# Patient Record
Sex: Male | Born: 1990 | Race: White | Hispanic: No | Marital: Single | Smoking: Never smoker
Health system: Southern US, Community
[De-identification: ages and names within clinical notes are randomized; demographics above are authoritative.]

## PROBLEM LIST (undated history)

## (undated) DIAGNOSIS — G8929 Other chronic pain: Secondary | ICD-10-CM

## (undated) DIAGNOSIS — K828 Other specified diseases of gallbladder: Secondary | ICD-10-CM

## (undated) DIAGNOSIS — T8859XA Other complications of anesthesia, initial encounter: Secondary | ICD-10-CM

## (undated) DIAGNOSIS — T4145XA Adverse effect of unspecified anesthetic, initial encounter: Secondary | ICD-10-CM

## (undated) DIAGNOSIS — K219 Gastro-esophageal reflux disease without esophagitis: Secondary | ICD-10-CM

## (undated) DIAGNOSIS — F419 Anxiety disorder, unspecified: Secondary | ICD-10-CM

## (undated) DIAGNOSIS — J45909 Unspecified asthma, uncomplicated: Secondary | ICD-10-CM

## (undated) DIAGNOSIS — G5732 Lesion of lateral popliteal nerve, left lower limb: Secondary | ICD-10-CM

## (undated) DIAGNOSIS — Z8781 Personal history of (healed) traumatic fracture: Secondary | ICD-10-CM

## (undated) DIAGNOSIS — G629 Polyneuropathy, unspecified: Secondary | ICD-10-CM

## (undated) DIAGNOSIS — F1911 Other psychoactive substance abuse, in remission: Secondary | ICD-10-CM

## (undated) DIAGNOSIS — Z789 Other specified health status: Secondary | ICD-10-CM

## (undated) DIAGNOSIS — M869 Osteomyelitis, unspecified: Secondary | ICD-10-CM

## (undated) DIAGNOSIS — G2581 Restless legs syndrome: Secondary | ICD-10-CM

## (undated) DIAGNOSIS — Y990 Civilian activity done for income or pay: Secondary | ICD-10-CM

## (undated) DIAGNOSIS — Z9889 Other specified postprocedural states: Secondary | ICD-10-CM

## (undated) DIAGNOSIS — Z87448 Personal history of other diseases of urinary system: Secondary | ICD-10-CM

## (undated) DIAGNOSIS — R112 Nausea with vomiting, unspecified: Secondary | ICD-10-CM

## (undated) DIAGNOSIS — F4310 Post-traumatic stress disorder, unspecified: Secondary | ICD-10-CM

## (undated) HISTORY — DX: Other specified diseases of gallbladder: K82.8

## (undated) HISTORY — PX: FEMUR IM NAIL: SHX1597

## (undated) HISTORY — PX: CHOLECYSTECTOMY: SHX55

## (undated) HISTORY — PX: OTHER SURGICAL HISTORY: SHX169

## (undated) HISTORY — PX: TONSILLECTOMY: SUR1361

## (undated) HISTORY — PX: EXTERNAL FIXATOR APPLICATION: SHX1554

---

## 2010-01-23 ENCOUNTER — Inpatient Hospital Stay: Payer: Self-pay | Admitting: Unknown Physician Specialty

## 2012-09-03 ENCOUNTER — Emergency Department: Payer: Self-pay | Admitting: Unknown Physician Specialty

## 2012-09-03 LAB — CBC
HCT: 46.5 % (ref 40.0–52.0)
MCH: 30.5 pg (ref 26.0–34.0)
MCV: 87 fL (ref 80–100)
RBC: 5.36 10*6/uL (ref 4.40–5.90)
WBC: 11.8 10*3/uL — ABNORMAL HIGH (ref 3.8–10.6)

## 2012-09-03 LAB — COMPREHENSIVE METABOLIC PANEL
Alkaline Phosphatase: 68 U/L (ref 50–136)
Anion Gap: 7 (ref 7–16)
BUN: 13 mg/dL (ref 7–18)
Chloride: 107 mmol/L (ref 98–107)
Co2: 23 mmol/L (ref 21–32)
Creatinine: 1.19 mg/dL (ref 0.60–1.30)
EGFR (Non-African Amer.): >60
Osmolality: 274 (ref 275–301)
Potassium: 3.8 mmol/L (ref 3.5–5.1)
SGOT(AST): 27 U/L (ref 15–37)
SGPT (ALT): 29 U/L (ref 12–78)
Sodium: 137 mmol/L (ref 136–145)
Total Protein: 8.3 g/dL — ABNORMAL HIGH (ref 6.4–8.2)

## 2014-11-23 ENCOUNTER — Other Ambulatory Visit: Payer: Self-pay | Admitting: Urology

## 2014-11-29 ENCOUNTER — Encounter (HOSPITAL_COMMUNITY): Payer: Self-pay | Admitting: *Deleted

## 2014-11-29 NOTE — Progress Notes (Signed)
I called mother since noone had called me back regarding medications and she will call and leave me a voice mail to update medications.

## 2014-11-29 NOTE — Progress Notes (Signed)
Called and left message on home phone for clarifications of medications.

## 2014-12-01 ENCOUNTER — Ambulatory Visit (HOSPITAL_COMMUNITY)
Admission: RE | Admit: 2014-12-01 | Discharge: 2014-12-01 | Disposition: A | Discharge status: Home / Self Care | Payer: Worker's Compensation | Source: Ambulatory Visit | Attending: Urology | Admitting: Urology

## 2014-12-01 ENCOUNTER — Encounter (HOSPITAL_COMMUNITY): Payer: Self-pay | Admitting: *Deleted

## 2014-12-01 ENCOUNTER — Encounter (HOSPITAL_COMMUNITY)
Admission: RE | Disposition: A | Discharge status: Home / Self Care | Payer: Self-pay | Source: Ambulatory Visit | Attending: Urology

## 2014-12-01 ENCOUNTER — Ambulatory Visit (HOSPITAL_COMMUNITY): Payer: Worker's Compensation | Admitting: Anesthesiology

## 2014-12-01 DIAGNOSIS — Z79891 Long term (current) use of opiate analgesic: Secondary | ICD-10-CM | POA: Diagnosis not present

## 2014-12-01 DIAGNOSIS — Z79899 Other long term (current) drug therapy: Secondary | ICD-10-CM | POA: Diagnosis not present

## 2014-12-01 DIAGNOSIS — N210 Calculus in bladder: Secondary | ICD-10-CM | POA: Diagnosis not present

## 2014-12-01 DIAGNOSIS — F419 Anxiety disorder, unspecified: Secondary | ICD-10-CM | POA: Insufficient documentation

## 2014-12-01 HISTORY — PX: CYSTOSCOPY WITH LITHOLAPAXY: SHX1425

## 2014-12-01 HISTORY — DX: Civilian activity done for income or pay: Y99.0

## 2014-12-01 HISTORY — DX: Anxiety disorder, unspecified: F41.9

## 2014-12-01 LAB — BASIC METABOLIC PANEL
ANION GAP: 6 (ref 5–15)
BUN: 11 mg/dL (ref 6–20)
CALCIUM: 9.3 mg/dL (ref 8.9–10.3)
CO2: 27 mmol/L (ref 22–32)
Chloride: 104 mmol/L (ref 101–111)
Creatinine, Ser: 0.97 mg/dL (ref 0.61–1.24)
GFR calc Af Amer: >60 mL/min (ref >60)
GFR calc non Af Amer: >60 mL/min (ref >60)
GLUCOSE: 93 mg/dL (ref 65–99)
Potassium: 3.8 mmol/L (ref 3.5–5.1)
Sodium: 137 mmol/L (ref 135–145)

## 2014-12-01 LAB — CBC
HEMATOCRIT: 39.9 % (ref 39.0–52.0)
Hemoglobin: 13.4 g/dL (ref 13.0–17.0)
MCH: 28.8 pg (ref 26.0–34.0)
MCHC: 33.6 g/dL (ref 30.0–36.0)
MCV: 85.6 fL (ref 78.0–100.0)
PLATELETS: 201 10*3/uL (ref 150–400)
RBC: 4.66 MIL/uL (ref 4.22–5.81)
RDW: 13.2 % (ref 11.5–15.5)
WBC: 6 10*3/uL (ref 4.0–10.5)

## 2014-12-01 LAB — GLUCOSE, CAPILLARY: GLUCOSE-CAPILLARY: 93 mg/dL (ref 65–99)

## 2014-12-01 SURGERY — CYSTOSCOPY, WITH BLADDER CALCULUS LITHOLAPAXY
Anesthesia: General | Site: Bladder

## 2014-12-01 MED ORDER — MIDAZOLAM HCL 2 MG/2ML IJ SOLN
INTRAMUSCULAR | Status: AC
  Filled 2014-12-01: qty 4

## 2014-12-01 MED ORDER — HYDROMORPHONE HCL 1 MG/ML IJ SOLN
0.2500 mg | INTRAMUSCULAR | Status: DC | PRN
Start: 2014-12-01 — End: 2014-12-01
  Administered 2014-12-01 (×4): 0.5 mg via INTRAVENOUS

## 2014-12-01 MED ORDER — ONDANSETRON HCL 4 MG/2ML IJ SOLN
INTRAMUSCULAR | Status: DC | PRN
  Administered 2014-12-01: 4 mg via INTRAVENOUS

## 2014-12-01 MED ORDER — PROPOFOL 10 MG/ML IV BOLUS
INTRAVENOUS | Status: AC
  Filled 2014-12-01: qty 20

## 2014-12-01 MED ORDER — ONDANSETRON HCL 4 MG/2ML IJ SOLN: 4.0000 mg | Freq: Once | INTRAMUSCULAR | Status: DC | PRN

## 2014-12-01 MED ORDER — SODIUM CHLORIDE 0.9 % IR SOLN
Status: DC | PRN
  Administered 2014-12-01: 3000 mL

## 2014-12-01 MED ORDER — BELLADONNA ALKALOIDS-OPIUM 16.2-60 MG RE SUPP
RECTAL | Status: AC
  Filled 2014-12-01: qty 1

## 2014-12-01 MED ORDER — MIDAZOLAM HCL 5 MG/5ML IJ SOLN
INTRAMUSCULAR | Status: DC | PRN
  Administered 2014-12-01: 2 mg via INTRAVENOUS

## 2014-12-01 MED ORDER — LIDOCAINE HCL (CARDIAC) 20 MG/ML IV SOLN
INTRAVENOUS | Status: AC
  Filled 2014-12-01: qty 5

## 2014-12-01 MED ORDER — LIDOCAINE HCL (CARDIAC) 20 MG/ML IV SOLN
INTRAVENOUS | Status: DC | PRN
  Administered 2014-12-01: 100 mg via INTRAVENOUS

## 2014-12-01 MED ORDER — HYDROMORPHONE HCL 1 MG/ML IJ SOLN
INTRAMUSCULAR | Status: AC
  Filled 2014-12-01: qty 1

## 2014-12-01 MED ORDER — STERILE WATER FOR IRRIGATION IR SOLN
Status: DC | PRN
  Administered 2014-12-01: 500 mL

## 2014-12-01 MED ORDER — DEXAMETHASONE SODIUM PHOSPHATE 10 MG/ML IJ SOLN
INTRAMUSCULAR | Status: DC | PRN
  Administered 2014-12-01: 10 mg via INTRAVENOUS

## 2014-12-01 MED ORDER — FENTANYL CITRATE (PF) 100 MCG/2ML IJ SOLN
25.0000 ug | INTRAMUSCULAR | Status: DC | PRN
  Administered 2014-12-01 (×2): 50 ug via INTRAVENOUS

## 2014-12-01 MED ORDER — OXYCODONE HCL 15 MG PO TABS: 15.0000 mg | ORAL_TABLET | ORAL | Status: DC | PRN

## 2014-12-01 MED ORDER — LIDOCAINE HCL 2 % EX GEL
CUTANEOUS | Status: AC
  Filled 2014-12-01: qty 10

## 2014-12-01 MED ORDER — 0.9 % SODIUM CHLORIDE (POUR BTL) OPTIME
TOPICAL | Status: DC | PRN
  Administered 2014-12-01: 1000 mL

## 2014-12-01 MED ORDER — ONDANSETRON HCL 4 MG/2ML IJ SOLN
INTRAMUSCULAR | Status: AC
  Filled 2014-12-01: qty 2

## 2014-12-01 MED ORDER — FENTANYL CITRATE (PF) 100 MCG/2ML IJ SOLN
INTRAMUSCULAR | Status: AC
  Filled 2014-12-01: qty 4

## 2014-12-01 MED ORDER — GENTAMICIN IN SALINE 1.6-0.9 MG/ML-% IV SOLN: 80.0000 mg | INTRAVENOUS | Status: DC

## 2014-12-01 MED ORDER — LACTATED RINGERS IV SOLN
INTRAVENOUS | Status: DC
  Administered 2014-12-01: 1000 mL via INTRAVENOUS

## 2014-12-01 MED ORDER — DEXTROSE 5 % IV SOLN
360.0000 mg | INTRAVENOUS | Status: AC
  Administered 2014-12-01: 360 mg via INTRAVENOUS
  Filled 2014-12-01: qty 9

## 2014-12-01 MED ORDER — FENTANYL CITRATE (PF) 100 MCG/2ML IJ SOLN
INTRAMUSCULAR | Status: AC
  Filled 2014-12-01: qty 2

## 2014-12-01 MED ORDER — DEXAMETHASONE SODIUM PHOSPHATE 10 MG/ML IJ SOLN
INTRAMUSCULAR | Status: AC
  Filled 2014-12-01: qty 1

## 2014-12-01 MED ORDER — FENTANYL CITRATE (PF) 100 MCG/2ML IJ SOLN
INTRAMUSCULAR | Status: DC | PRN
  Administered 2014-12-01: 100 ug via INTRAVENOUS

## 2014-12-01 MED ORDER — PROPOFOL 10 MG/ML IV BOLUS
INTRAVENOUS | Status: DC | PRN
  Administered 2014-12-01: 200 mg via INTRAVENOUS

## 2014-12-01 SURGICAL SUPPLY — 16 items
BAG URINE DRAINAGE (UROLOGICAL SUPPLIES) ×4 IMPLANT
BAG URO CATCHER STRL LF (DRAPE) ×4 IMPLANT
BASKET LASER NITINOL 1.9FR (BASKET) IMPLANT
BASKET STNLS GEMINI 4WIRE 3FR (BASKET) IMPLANT
CATH FOLEY 2WAY SLVR 18FR 30CC (CATHETERS) ×4 IMPLANT
CATH INTERMIT  6FR 70CM (CATHETERS) ×4 IMPLANT
EXTRACTOR STONE NITINOL NGAGE (UROLOGICAL SUPPLIES) IMPLANT
GLOVE BIO SURGEON STRL SZ8 (GLOVE) ×4 IMPLANT
GOWN STRL REUS W/TWL LRG LVL3 (GOWN DISPOSABLE) ×8 IMPLANT
GUIDEWIRE ANG ZIPWIRE 038X150 (WIRE) ×4 IMPLANT
GUIDEWIRE STR DUAL SENSOR (WIRE) ×4 IMPLANT
MANIFOLD NEPTUNE II (INSTRUMENTS) ×4 IMPLANT
PACK CYSTO (CUSTOM PROCEDURE TRAY) ×4 IMPLANT
TUBE FEEDING 8FR 16IN STR KANG (MISCELLANEOUS) IMPLANT
TUBING CONNECTING 10 (TUBING) ×3 IMPLANT
TUBING CONNECTING 10' (TUBING) ×1

## 2014-12-01 NOTE — Brief Op Note (Signed)
12/01/2014  1:42 PM  PATIENT:  Allen Sims  24 y.o. male  PRE-OPERATIVE DIAGNOSIS:  BLADDER STONE  POST-OPERATIVE DIAGNOSIS:  BLADDER STONE  PROCEDURE:  Procedure(s): CYSTOSCOPY LITHOLAPAXY (N/A)  SURGEON:  Surgeon(s) and Role:    * Malen Gauze, MD - Primary  PHYSICIAN ASSISTANT:   ASSISTANTS: none   ANESTHESIA:   general  EBL:   minimal  BLOOD ADMINISTERED:none  DRAINS: Urinary Catheter (Foley)   LOCAL MEDICATIONS USED:  NONE  SPECIMEN:  Source of Specimen:  bladder calculi  DISPOSITION OF SPECIMEN:  N/A  COUNTS:  YES  TOURNIQUET:  * No tourniquets in log *  DICTATION: .Note written in EPIC  PLAN OF CARE: Discharge to home after PACU  PATIENT DISPOSITION:  PACU - hemodynamically stable.   Delay start of Pharmacological VTE agent (>24hrs) due to surgical blood loss or risk of bleeding: not applicable

## 2014-12-01 NOTE — Anesthesia Postprocedure Evaluation (Signed)
  Anesthesia Post-op Note  Patient: Allen Sims  Procedure(s) Performed: Procedure(s) (LRB): CYSTOSCOPY LITHOLAPAXY (N/A)  Patient Location: PACU  Anesthesia Type: General  Level of Consciousness: awake and alert   Airway and Oxygen Therapy: Patient Spontanous Breathing  Post-op Pain: mild  Post-op Assessment: Post-op Vital signs reviewed, Patient's Cardiovascular Status Stable, Respiratory Function Stable, Patent Airway and No signs of Nausea or vomiting  Last Vitals:  Filed Vitals:   12/01/14 1034  BP: 128/77  Pulse: 108  Temp: 36.4 C  Resp: 18    Post-op Vital Signs: stable   Complications: No apparent anesthesia complications

## 2014-12-01 NOTE — Transfer of Care (Signed)
Immediate Anesthesia Transfer of Care Note  Patient: Allen Sims  Procedure(s) Performed: Procedure(s): CYSTOSCOPY LITHOLAPAXY (N/A)  Patient Location: PACU  Anesthesia Type:General  Level of Consciousness: sedated  Airway & Oxygen Therapy: Patient Spontanous Breathing and Patient connected to face mask oxygen  Post-op Assessment: Report given to RN and Post -op Vital signs reviewed and stable  Post vital signs: Reviewed and stable  Last Vitals:  Filed Vitals:   12/01/14 1034  BP: 128/77  Pulse: 108  Temp: 36.4 C  Resp: 18    Complications: No apparent anesthesia complications

## 2014-12-01 NOTE — Progress Notes (Signed)
Dr. Delrae Alfred notified of patient's continual post-op pain- orders given- also made aware of patient's heart rates being between 107-115

## 2014-12-01 NOTE — Discharge Instructions (Signed)
Foley Catheter Care A Foley catheter is a soft, flexible tube that is placed into the bladder to drain urine. A Foley catheter may be inserted if:  You leak urine or are not able to control when you urinate (urinary incontinence).  You are not able to urinate when you need to (urinary retention).  You had prostate surgery or surgery on the genitals.  You have certain medical conditions, such as multiple sclerosis, dementia, or a spinal cord injury. If you are going home with a Foley catheter in place, follow the instructions below. TAKING CARE OF THE CATHETER  Wash your hands with soap and water.  Using mild soap and warm water on a clean washcloth:  Clean the area on your body closest to the catheter insertion site using a circular motion, moving away from the catheter. Never wipe toward the catheter because this could sweep bacteria up into the urethra and cause infection.  Remove all traces of soap. Pat the area dry with a clean towel. For males, reposition the foreskin.  Attach the catheter to your leg so there is no tension on the catheter. Use adhesive tape or a leg strap. If you are using adhesive tape, remove any sticky residue left behind by the previous tape you used.  Keep the drainage bag below the level of the bladder, but keep it off the floor.  Check throughout the day to be sure the catheter is working and urine is draining freely. Make sure the tubing does not become kinked.  Do not pull on the catheter or try to remove it. Pulling could damage internal tissues. TAKING CARE OF THE DRAINAGE BAGS You will be given two drainage bags to take home. One is a large overnight drainage bag, and the other is a smaller leg bag that fits underneath clothing. You may wear the overnight bag at any time, but you should never wear the smaller leg bag at night. Follow the instructions below for how to empty, change, and clean your drainage bags. Emptying the Drainage Bag You must empty  your drainage bag when it is  - full or at least 2-3 times a day.  Wash your hands with soap and water.  Keep the drainage bag below your hips, below the level of your bladder. This stops urine from going back into the tubing and into your bladder.  Hold the dirty bag over the toilet or a clean container.  Open the pour spout at the bottom of the bag and empty the urine into the toilet or container. Do not let the pour spout touch the toilet, container, or any other surface. Doing so can place bacteria on the bag, which can cause an infection.  Clean the pour spout with a gauze pad or cotton ball that has rubbing alcohol on it.  Close the pour spout.  Attach the bag to your leg with adhesive tape or a leg strap.  Wash your hands well. Changing the Drainage Bag Change your drainage bag once a month or sooner if it starts to smell bad or look dirty. Below are steps to follow when changing the drainage bag.  Wash your hands with soap and water.  Pinch off the rubber catheter so that urine does not spill out.  Disconnect the catheter tube from the drainage tube at the connection valve. Do not let the tubes touch any surface.  Clean the end of the catheter tube with an alcohol wipe. Use a different alcohol wipe to clean the  end of the drainage tube.  Connect the catheter tube to the drainage tube of the clean drainage bag.  Attach the new bag to the leg with adhesive tape or a leg strap. Avoid attaching the new bag too tightly.  Wash your hands well. Cleaning the Drainage Bag 1. Wash your hands with soap and water. 2. Wash the bag in warm, soapy water. 3. Rinse the bag thoroughly with warm water. 4. Fill the bag with a solution of white vinegar and water (1 cup vinegar to 1 qt warm water [.2 L vinegar to 1 L warm water]). Close the bag and soak it for 30 minutes in the solution. 5. Rinse the bag with warm water. 6. Hang the bag to dry with the pour spout open and hanging  downward. 7. Store the clean bag (once it is dry) in a clean plastic bag. 8. Wash your hands well. PREVENTING INFECTION  Wash your hands before and after handling your catheter.  Take showers daily and wash the area where the catheter enters your body. Do not take baths. Replace wet leg straps with dry ones, if this applies.  Do not use powders, sprays, or lotions on the genital area. Only use creams, lotions, or ointments as directed by your caregiver.  For females, wipe from front to back after each bowel movement.  Drink enough fluids to keep your urine clear or pale yellow unless you have a fluid restriction.  Do not let the drainage bag or tubing touch or lie on the floor.  Wear cotton underwear to absorb moisture and to keep your skin drier. SEEK MEDICAL CARE IF:   Your urine is cloudy or smells unusually bad.  Your catheter becomes clogged.  You are not draining urine into the bag or your bladder feels full.  Your catheter starts to leak. SEEK IMMEDIATE MEDICAL CARE IF:   You have pain, swelling, redness, or pus where the catheter enters the body.  You have pain in the abdomen, legs, lower back, or bladder.  You have a fever.  You see blood fill the catheter, or your urine is pink or red.  You have nausea, vomiting, or chills.  Your catheter gets pulled out. MAKE SURE YOU:   Understand these instructions.  Will watch your condition.  Will get help right away if you are not doing well or get worse. Document Released: 04/15/2005 Document Revised: 08/30/2013 Document Reviewed: 04/06/2012 [facility] Patient Information 2015 [place], [place]. This information is not intended to replace advice given to you by your health care provider. Make sure you discuss any questions you have with your health care provider.     General Anesthesia, Care After Refer to this sheet in the next few weeks. These instructions provide you with information on caring for yourself after  your procedure. Your health care provider may also give you more specific instructions. Your treatment has been planned according to current medical practices, but problems sometimes occur. Call your health care provider if you have any problems or questions after your procedure. WHAT TO EXPECT AFTER THE PROCEDURE After the procedure, it is typical to experience:  Sleepiness.  Nausea and vomiting. HOME CARE INSTRUCTIONS  For the first 24 hours after general anesthesia:  Have a responsible person with you.  Do not drive a car. If you are alone, do not take public transportation.  Do not drink alcohol.  Do not take medicine that has not been prescribed by your health care provider.  Do not sign  important papers or make important decisions.  You may resume a normal diet and activities as directed by your health care provider.  Change bandages (dressings) as directed.  If you have questions or problems that seem related to general anesthesia, call the hospital and ask for the anesthetist or anesthesiologist on call. SEEK MEDICAL CARE IF:  You have nausea and vomiting that continue the day after anesthesia.  You develop a rash. SEEK IMMEDIATE MEDICAL CARE IF:   You have difficulty breathing.  You have chest pain.  You have any allergic problems. Document Released: 07/22/2000 Document Revised: 04/20/2013 Document Reviewed: 10/29/2012 [facility] Patient Information 2015 [place], [place]. This information is not intended to replace advice given to you by your health care provider. Make sure you discuss any questions you have with your health care provider.
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
## 2014-12-02 ENCOUNTER — Encounter (HOSPITAL_COMMUNITY): Payer: Self-pay | Admitting: Urology

## 2014-12-02 NOTE — Op Note (Signed)
.  Preoperative diagnosis: Bladder Caluli  Postoperative diagnosis: Same  Procedure: 1 cystoscopy 2. cystolithalopaxy  Attending: Wilkie Aye  Anesthesia: General  Estimated blood loss: None  Drains: 18 French foley catheter  Specimens: stone for analysis  Antibiotics: Gentamicin  Findings: numerous bladder calculi. Ureteral orifices in normal anatomic location  Indications: Patient is a 24 year old male with a history of pelvic fracture and bladder repair. He developed bladder stones causes urinary retention.  After discussing treatment options, they decided proceed with cystolithalopaxy.  Procedure her in detail: The patient was brought to the operating room and a brief timeout was done to ensure correct patient, correct procedure, correct site.  General anesthesia was administered patient was placed in dorsal lithotomy position.  Their genitalia was then prepped and draped in usual sterile fashion.  A rigid 22 French cystoscope was passed in the urethra and the bladder.  Bladder was inspected free masses or lesions.  the ureteral orifices were in the normal orthotopic locations.Using the grasper the bladder stones were broken into smaller pieces and the pieces were then removed. We then irrigated the bladder several to remove the residual stone fragments. A foley catheter was then placed. the bladder was then drained and this concluded the procedure which was well tolerated by patient.  Complications: None  Condition: Stable, extubated, transferred to PACU  Plan: Patient is to be discharged home. He will followup tomorrow for a voiding trial

## 2014-12-21 ENCOUNTER — Encounter (HOSPITAL_COMMUNITY): Payer: Self-pay

## 2014-12-21 ENCOUNTER — Encounter (HOSPITAL_COMMUNITY)
Admission: RE | Admit: 2014-12-21 | Discharge: 2014-12-21 | Disposition: A | Discharge status: Home / Self Care | Payer: Worker's Compensation | Source: Ambulatory Visit | Attending: Orthopedic Surgery | Admitting: Orthopedic Surgery

## 2014-12-21 DIAGNOSIS — F329 Major depressive disorder, single episode, unspecified: Secondary | ICD-10-CM | POA: Diagnosis not present

## 2014-12-21 DIAGNOSIS — Z4789 Encounter for other orthopedic aftercare: Secondary | ICD-10-CM | POA: Diagnosis present

## 2014-12-21 DIAGNOSIS — T8489XA Other specified complication of internal orthopedic prosthetic devices, implants and grafts, initial encounter: Secondary | ICD-10-CM | POA: Diagnosis not present

## 2014-12-21 DIAGNOSIS — Z79891 Long term (current) use of opiate analgesic: Secondary | ICD-10-CM | POA: Diagnosis not present

## 2014-12-21 LAB — CBC
HCT: 41.1 % (ref 39.0–52.0)
Hemoglobin: 14.3 g/dL (ref 13.0–17.0)
MCH: 29.3 pg (ref 26.0–34.0)
MCHC: 34.8 g/dL (ref 30.0–36.0)
MCV: 84.2 fL (ref 78.0–100.0)
PLATELETS: 225 10*3/uL (ref 150–400)
RBC: 4.88 MIL/uL (ref 4.22–5.81)
RDW: 13.2 % (ref 11.5–15.5)
WBC: 7.2 10*3/uL (ref 4.0–10.5)

## 2014-12-21 LAB — COMPREHENSIVE METABOLIC PANEL
ALK PHOS: 90 U/L (ref 38–126)
ALT: 25 U/L (ref 17–63)
ANION GAP: 8 (ref 5–15)
AST: 17 U/L (ref 15–41)
Albumin: 3.9 g/dL (ref 3.5–5.0)
BUN: 13 mg/dL (ref 6–20)
CALCIUM: 9.2 mg/dL (ref 8.9–10.3)
CHLORIDE: 100 mmol/L — AB (ref 101–111)
CO2: 30 mmol/L (ref 22–32)
Creatinine, Ser: 0.82 mg/dL (ref 0.61–1.24)
Glucose, Bld: 92 mg/dL (ref 65–99)
Potassium: 4.1 mmol/L (ref 3.5–5.1)
SODIUM: 138 mmol/L (ref 135–145)
Total Bilirubin: 0.3 mg/dL (ref 0.3–1.2)
Total Protein: 7.4 g/dL (ref 6.5–8.1)

## 2014-12-21 MED ORDER — CLINDAMYCIN PHOSPHATE 900 MG/50ML IV SOLN: 900.0000 mg | INTRAVENOUS | Status: DC

## 2014-12-21 MED ORDER — CHLORHEXIDINE GLUCONATE 4 % EX LIQD: 60.0000 mL | CUTANEOUS | Status: DC
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
## 2014-12-21 NOTE — Pre-Procedure Instructions (Addendum)
Allen Sims  12/21/2014      [facility] [address], [place] - [address] AT [facility] OF [address] [place] [address] Phone: [phone] Fax: [phone]    Your procedure is scheduled on  Thursday 12/22/14  Report to [facility] Admitting at [facility].M.  Call this number if you have problems the morning of surgery:  [phone]   Remember:  Do not eat food or drink liquids after midnight.  Take these medicines the morning of surgery with A SIP OF WATER  TYLENOL OR OXYCODONE IF NEEDED, VENLAFAXINE(EFFEXOR)  (STOP MULTIVITAMIN TIL AFTER SURGERY)   Do not wear jewelry, make-up or nail polish.  Do not wear lotions, powders, or perfumes.  You may wear deodorant.  Do not shave 48 hours prior to surgery.  Men may shave face and neck.  Do not bring valuables to the hospital.  [facility] is not responsible for any belongings or valuables.  Contacts, dentures or bridgework may not be worn into surgery.  Leave your suitcase in the car.  After surgery it may be brought to your room.  For patients admitted to the hospital, discharge time will be determined by your treatment team.  Patients discharged the day of surgery will not be allowed to drive home.   Name and phone number of your driver:   Special instructions:  [place] - Preparing for Surgery  Before surgery, you can play an important role.  Because skin is not sterile, your skin needs to be as free of germs as possible.  You can reduce the number of germs on you skin by washing with CHG (chlorahexidine gluconate) soap before surgery.  CHG is an antiseptic cleaner which kills germs and bonds with the skin to continue killing germs even after washing.  Please DO NOT use if you have an allergy to CHG or antibacterial soaps.  If your skin becomes reddened/irritated stop using the CHG and inform your nurse when you arrive at Short Stay.  Do not shave (including legs and  underarms) for at least 48 hours prior to the first CHG shower.  You may shave your face.  Please follow these instructions carefully:   1.  Shower with CHG Soap the night before surgery and the                                morning of Surgery.  2.  If you choose to wash your hair, wash your hair first as usual with your       normal shampoo.  3.  After you shampoo, rinse your hair and body thoroughly to remove the                      Shampoo.  4.  Use CHG as you would any other liquid soap.  You can apply chg directly       to the skin and wash gently with scrungie or a clean washcloth.  5.  Apply the CHG Soap to your body ONLY FROM THE NECK DOWN.        Do not use on open wounds or open sores.  Avoid contact with your eyes,       ears, mouth and genitals (private parts).  Wash genitals (private parts)       with your normal soap.  6.  Wash thoroughly, paying special attention to the area where your surgery        will be performed.  7.  Thoroughly rinse your body with warm water from the neck down.  8.  DO NOT shower/wash with your normal soap after using and rinsing off       the CHG Soap.  9.  Pat yourself dry with a clean towel.            10.  Wear clean pajamas.            11.  Place clean sheets on your bed the night of your first shower and do not        sleep with pets.  Day of Surgery  Do not apply any lotions/deoderants the morning of surgery.  Please wear clean clothes to the hospital/surgery center.    Please read over the following fact sheets that you were given. Pain Booklet, Coughing and Deep Breathing and Surgical Site Infection Prevention

## 2014-12-22 ENCOUNTER — Ambulatory Visit (HOSPITAL_COMMUNITY): Payer: Worker's Compensation

## 2014-12-22 ENCOUNTER — Encounter (HOSPITAL_COMMUNITY): Payer: Self-pay | Admitting: Surgery

## 2014-12-22 ENCOUNTER — Encounter (HOSPITAL_COMMUNITY)
Admission: RE | Disposition: A | Discharge status: Home / Self Care | Payer: Self-pay | Source: Ambulatory Visit | Attending: Orthopedic Surgery

## 2014-12-22 ENCOUNTER — Ambulatory Visit (HOSPITAL_COMMUNITY): Payer: Worker's Compensation | Admitting: Anesthesiology

## 2014-12-22 ENCOUNTER — Ambulatory Visit (HOSPITAL_COMMUNITY)
Admission: RE | Admit: 2014-12-22 | Discharge: 2014-12-22 | Disposition: A | Discharge status: Home / Self Care | Payer: Worker's Compensation | Source: Ambulatory Visit | Attending: Orthopedic Surgery | Admitting: Orthopedic Surgery

## 2014-12-22 DIAGNOSIS — T8489XA Other specified complication of internal orthopedic prosthetic devices, implants and grafts, initial encounter: Secondary | ICD-10-CM | POA: Insufficient documentation

## 2014-12-22 DIAGNOSIS — S32810A Multiple fractures of pelvis with stable disruption of pelvic ring, initial encounter for closed fracture: Secondary | ICD-10-CM

## 2014-12-22 DIAGNOSIS — F329 Major depressive disorder, single episode, unspecified: Secondary | ICD-10-CM | POA: Diagnosis not present

## 2014-12-22 DIAGNOSIS — Z79891 Long term (current) use of opiate analgesic: Secondary | ICD-10-CM | POA: Diagnosis not present

## 2014-12-22 DIAGNOSIS — Z419 Encounter for procedure for purposes other than remedying health state, unspecified: Secondary | ICD-10-CM

## 2014-12-22 DIAGNOSIS — Z4789 Encounter for other orthopedic aftercare: Secondary | ICD-10-CM | POA: Diagnosis not present

## 2014-12-22 HISTORY — PX: EXTERNAL FIXATION PELVIS: SHX1551

## 2014-12-22 HISTORY — PX: BLADDER REPAIR: SHX76

## 2014-12-22 HISTORY — PX: HARDWARE REMOVAL: SHX979

## 2014-12-22 HISTORY — DX: Other psychoactive substance abuse, in remission: F19.11

## 2014-12-22 SURGERY — REMOVAL, HARDWARE
Anesthesia: General | Site: Knee | Laterality: Right

## 2014-12-22 MED ORDER — FENTANYL CITRATE (PF) 250 MCG/5ML IJ SOLN
INTRAMUSCULAR | Status: DC | PRN
  Administered 2014-12-22 (×3): 50 ug via INTRAVENOUS
  Administered 2014-12-22: 100 ug via INTRAVENOUS

## 2014-12-22 MED ORDER — PROPOFOL 10 MG/ML IV BOLUS
INTRAVENOUS | Status: AC
  Filled 2014-12-22: qty 20

## 2014-12-22 MED ORDER — PROMETHAZINE HCL 25 MG/ML IJ SOLN: 6.2500 mg | INTRAMUSCULAR | Status: DC | PRN

## 2014-12-22 MED ORDER — MIDAZOLAM HCL 2 MG/2ML IJ SOLN
INTRAMUSCULAR | Status: AC
  Filled 2014-12-22: qty 2

## 2014-12-22 MED ORDER — MIDAZOLAM HCL 5 MG/5ML IJ SOLN
INTRAMUSCULAR | Status: DC | PRN
  Administered 2014-12-22: 2 mg via INTRAVENOUS

## 2014-12-22 MED ORDER — ACETAMINOPHEN 325 MG PO TABS
ORAL_TABLET | ORAL | Status: AC
  Administered 2014-12-22: 650 mg
  Filled 2014-12-22: qty 2

## 2014-12-22 MED ORDER — CEFAZOLIN SODIUM-DEXTROSE 2-3 GM-% IV SOLR
2.0000 g | Freq: Once | INTRAVENOUS | Status: AC
  Administered 2014-12-22: 2 g via INTRAVENOUS

## 2014-12-22 MED ORDER — KETOROLAC TROMETHAMINE 30 MG/ML IJ SOLN
30.0000 mg | Freq: Once | INTRAMUSCULAR | Status: AC
Start: 2014-12-22 — End: 2014-12-22
  Administered 2014-12-22: 30 mg via INTRAVENOUS

## 2014-12-22 MED ORDER — OXYCODONE HCL 5 MG PO TABS
20.0000 mg | ORAL_TABLET | Freq: Once | ORAL | Status: AC
  Administered 2014-12-22: 20 mg via ORAL

## 2014-12-22 MED ORDER — ONDANSETRON HCL 4 MG PO TABS: 4.0000 mg | ORAL_TABLET | Freq: Three times a day (TID) | ORAL | Status: DC | PRN

## 2014-12-22 MED ORDER — ONDANSETRON HCL 4 MG/2ML IJ SOLN
INTRAMUSCULAR | Status: DC | PRN
  Administered 2014-12-22: 4 mg via INTRAVENOUS

## 2014-12-22 MED ORDER — LACTATED RINGERS IV SOLN
INTRAVENOUS | Status: DC | PRN
  Administered 2014-12-22: 08:00:00 via INTRAVENOUS

## 2014-12-22 MED ORDER — HYDROMORPHONE HCL 1 MG/ML IJ SOLN
INTRAMUSCULAR | Status: AC
  Filled 2014-12-22: qty 1

## 2014-12-22 MED ORDER — HYDROMORPHONE HCL 1 MG/ML IJ SOLN
0.2500 mg | INTRAMUSCULAR | Status: DC | PRN
  Administered 2014-12-22 (×2): 1 mg via INTRAVENOUS

## 2014-12-22 MED ORDER — OXYCODONE-ACETAMINOPHEN 10-325 MG PO TABS: 1.0000 | ORAL_TABLET | Freq: Four times a day (QID) | ORAL | Status: DC | PRN

## 2014-12-22 MED ORDER — LIDOCAINE HCL (CARDIAC) 20 MG/ML IV SOLN
INTRAVENOUS | Status: DC | PRN
  Administered 2014-12-22: 50 mg via INTRAVENOUS

## 2014-12-22 MED ORDER — PROPOFOL 10 MG/ML IV BOLUS
INTRAVENOUS | Status: DC | PRN
  Administered 2014-12-22: 200 mg via INTRAVENOUS

## 2014-12-22 MED ORDER — MIDAZOLAM HCL 2 MG/2ML IJ SOLN
2.0000 mg | Freq: Once | INTRAMUSCULAR | Status: AC
  Administered 2014-12-22: 2 mg via INTRAVENOUS

## 2014-12-22 MED ORDER — MIDAZOLAM HCL 2 MG/2ML IJ SOLN
INTRAMUSCULAR | Status: AC
  Filled 2014-12-22: qty 4

## 2014-12-22 MED ORDER — FENTANYL CITRATE (PF) 250 MCG/5ML IJ SOLN
INTRAMUSCULAR | Status: AC
  Filled 2014-12-22: qty 5

## 2014-12-22 MED ORDER — KETOROLAC TROMETHAMINE 30 MG/ML IJ SOLN
INTRAMUSCULAR | Status: AC
  Administered 2014-12-22: 30 mg
  Filled 2014-12-22: qty 1

## 2014-12-22 MED ORDER — HYDROMORPHONE HCL 1 MG/ML IJ SOLN
INTRAMUSCULAR | Status: DC
Start: 2014-12-22 — End: 2014-12-22
  Filled 2014-12-22: qty 1

## 2014-12-22 MED ORDER — OXYCODONE HCL 5 MG PO TABS
ORAL_TABLET | ORAL | Status: AC
  Administered 2014-12-22: 20 mg
  Filled 2014-12-22: qty 4

## 2014-12-22 MED ORDER — CEFAZOLIN SODIUM-DEXTROSE 2-3 GM-% IV SOLR
INTRAVENOUS | Status: AC
  Filled 2014-12-22: qty 50

## 2014-12-22 MED ORDER — HYDROMORPHONE HCL 1 MG/ML IJ SOLN
0.2500 mg | INTRAMUSCULAR | Status: AC | PRN
  Administered 2014-12-22 (×2): 0.5 mg via INTRAVENOUS

## 2014-12-22 MED ORDER — 0.9 % SODIUM CHLORIDE (POUR BTL) OPTIME
TOPICAL | Status: DC | PRN
  Administered 2014-12-22: 1000 mL

## 2014-12-22 MED ORDER — ACETAMINOPHEN 325 MG PO TABS
650.0000 mg | ORAL_TABLET | Freq: Once | ORAL | Status: AC
  Administered 2014-12-22: 650 mg via ORAL

## 2014-12-22 SURGICAL SUPPLY — 73 items
BANDAGE ELASTIC 4 VELCRO ST LF (GAUZE/BANDAGES/DRESSINGS) ×4 IMPLANT
BANDAGE ELASTIC 6 VELCRO ST LF (GAUZE/BANDAGES/DRESSINGS) ×4 IMPLANT
BANDAGE ESMARK 6X9 LF (GAUZE/BANDAGES/DRESSINGS) IMPLANT
BLADE SURG 15 STRL LF DISP TIS (BLADE) ×2 IMPLANT
BLADE SURG 15 STRL SS (BLADE) ×2
BNDG CMPR 9X6 STRL LF SNTH (GAUZE/BANDAGES/DRESSINGS)
BNDG COHESIVE 6X5 TAN STRL LF (GAUZE/BANDAGES/DRESSINGS) ×4 IMPLANT
BNDG ESMARK 6X9 LF (GAUZE/BANDAGES/DRESSINGS)
BNDG GAUZE ELAST 4 BULKY (GAUZE/BANDAGES/DRESSINGS) IMPLANT
BRUSH SCRUB DISP (MISCELLANEOUS) ×8 IMPLANT
CANISTER SUCTION WELLS/JOHNSON (MISCELLANEOUS) ×4 IMPLANT
CLEANER TIP ELECTROSURG 2X2 (MISCELLANEOUS) IMPLANT
CLOSURE WOUND 1/2 X4 (GAUZE/BANDAGES/DRESSINGS)
COVER SURGICAL LIGHT HANDLE (MISCELLANEOUS) ×4 IMPLANT
CUFF TOURNIQUET SINGLE 18IN (TOURNIQUET CUFF) IMPLANT
CUFF TOURNIQUET SINGLE 24IN (TOURNIQUET CUFF) IMPLANT
CUFF TOURNIQUET SINGLE 34IN LL (TOURNIQUET CUFF) IMPLANT
DRAPE C-ARM 42X72 X-RAY (DRAPES) ×4 IMPLANT
DRAPE C-ARMOR (DRAPES) ×4 IMPLANT
DRAPE INCISE IOBAN 66X45 STRL (DRAPES) ×4 IMPLANT
DRAPE LAPAROTOMY TRNSV 102X78 (DRAPE) ×4 IMPLANT
DRAPE OEC MINIVIEW 54X84 (DRAPES) IMPLANT
DRAPE U-SHAPE 47X51 STRL (DRAPES) ×4 IMPLANT
DRSG ADAPTIC 3X8 NADH LF (GAUZE/BANDAGES/DRESSINGS) IMPLANT
DRSG MEPILEX BORDER 4X8 (GAUZE/BANDAGES/DRESSINGS) ×4 IMPLANT
DRSG MEPITEL 4X7.2 (GAUZE/BANDAGES/DRESSINGS) ×4 IMPLANT
DRSG PAD ABDOMINAL 8X10 ST (GAUZE/BANDAGES/DRESSINGS) IMPLANT
ELECT REM PT RETURN 9FT ADLT (ELECTROSURGICAL) ×4
ELECTRODE REM PT RTRN 9FT ADLT (ELECTROSURGICAL) ×2 IMPLANT
EVACUATOR 1/8 PVC DRAIN (DRAIN) IMPLANT
GAUZE SPONGE 4X4 12PLY STRL (GAUZE/BANDAGES/DRESSINGS) ×4 IMPLANT
GAUZE XEROFORM 5X9 LF (GAUZE/BANDAGES/DRESSINGS) IMPLANT
GLOVE BIO SURGEON STRL SZ7.5 (GLOVE) ×4 IMPLANT
GLOVE BIO SURGEON STRL SZ8 (GLOVE) ×4 IMPLANT
GLOVE BIOGEL PI IND STRL 7.5 (GLOVE) ×2 IMPLANT
GLOVE BIOGEL PI IND STRL 8 (GLOVE) ×2 IMPLANT
GLOVE BIOGEL PI INDICATOR 7.5 (GLOVE) ×2
GLOVE BIOGEL PI INDICATOR 8 (GLOVE) ×2
GOWN STRL REUS W/ TWL LRG LVL3 (GOWN DISPOSABLE) ×4 IMPLANT
GOWN STRL REUS W/ TWL XL LVL3 (GOWN DISPOSABLE) ×2 IMPLANT
GOWN STRL REUS W/TWL LRG LVL3 (GOWN DISPOSABLE) ×6
GOWN STRL REUS W/TWL XL LVL3 (GOWN DISPOSABLE) ×3
KIT BASIN OR (CUSTOM PROCEDURE TRAY) ×4 IMPLANT
KIT ROOM TURNOVER OR (KITS) ×4 IMPLANT
MANIFOLD NEPTUNE II (INSTRUMENTS) IMPLANT
NEEDLE 22X1 1/2 (OR ONLY) (NEEDLE) IMPLANT
NS IRRIG 1000ML POUR BTL (IV SOLUTION) ×4 IMPLANT
PACK GENERAL/GYN (CUSTOM PROCEDURE TRAY) IMPLANT
PACK ORTHO EXTREMITY (CUSTOM PROCEDURE TRAY) ×4 IMPLANT
PAD ARMBOARD 7.5X6 YLW CONV (MISCELLANEOUS) ×8 IMPLANT
PADDING CAST COTTON 6X4 STRL (CAST SUPPLIES) IMPLANT
PILLOW ABDUCTION HIP (SOFTGOODS) IMPLANT
SPONGE GAUZE 4X4 12PLY STER LF (GAUZE/BANDAGES/DRESSINGS) ×4 IMPLANT
SPONGE LAP 18X18 X RAY DECT (DISPOSABLE) ×4 IMPLANT
SPONGE SCRUB IODOPHOR (GAUZE/BANDAGES/DRESSINGS) ×4 IMPLANT
STAPLER VISISTAT 35W (STAPLE) ×4 IMPLANT
STOCKINETTE IMPERVIOUS LG (DRAPES) ×4 IMPLANT
STRIP CLOSURE SKIN 1/2X4 (GAUZE/BANDAGES/DRESSINGS) IMPLANT
SUCTION FRAZIER TIP 10 FR DISP (SUCTIONS) ×4 IMPLANT
SUT ETHILON 3 0 PS 1 (SUTURE) ×4 IMPLANT
SUT PDS AB 2-0 CT1 27 (SUTURE) IMPLANT
SUT VIC AB 0 CT1 27 (SUTURE)
SUT VIC AB 0 CT1 27XBRD ANBCTR (SUTURE) IMPLANT
SUT VIC AB 2-0 CT1 27 (SUTURE)
SUT VIC AB 2-0 CT1 TAPERPNT 27 (SUTURE) IMPLANT
SUT VIC AB 2-0 FS1 27 (SUTURE) IMPLANT
SYR CONTROL 10ML LL (SYRINGE) IMPLANT
TOWEL OR 17X24 6PK STRL BLUE (TOWEL DISPOSABLE) ×4 IMPLANT
TOWEL OR 17X26 10 PK STRL BLUE (TOWEL DISPOSABLE) ×4 IMPLANT
TUBE CONNECTING 12'X1/4 (SUCTIONS) ×1
TUBE CONNECTING 12X1/4 (SUCTIONS) ×3 IMPLANT
UNDERPAD 30X30 INCONTINENT (UNDERPADS AND DIAPERS) ×4 IMPLANT
YANKAUER SUCT BULB TIP NO VENT (SUCTIONS) ×4 IMPLANT

## 2014-12-22 NOTE — Anesthesia Postprocedure Evaluation (Signed)
  Anesthesia Post-op Note  Patient: Allen Sims  Procedure(s) Performed: Procedure(s): HARDWARE REMOVAL RIGHT KNEE  (Right) REMOVAL EXTERNAL FIXATION PELVIS (Bilateral)  Patient Location: PACU  Anesthesia Type:General  Level of Consciousness: awake, alert  and oriented  Airway and Oxygen Therapy: Patient Spontanous Breathing  Post-op Pain: none  Post-op Assessment: Post-op Vital signs reviewed LLE Motor Response: Purposeful movement LLE Sensation: Full sensation          Post-op Vital Signs: Reviewed  Last Vitals:  Filed Vitals:   12/22/14 1115  BP: 109/72  Pulse: 99  Temp:   Resp: 19    Complications: No apparent anesthesia complications

## 2014-12-22 NOTE — Brief Op Note (Signed)
12/22/2014  9:31 AM  PATIENT:  Allen Sims  24 y.o. male  PRE-OPERATIVE DIAGNOSIS:   1. RETAINED EXTERNAL FIXATION PELVIS 2. SYMPTOMATIC HARDWARE RIGHT KNEE  POST-OPERATIVE DIAGNOSIS:   1. RETAINED EXTERNAL FIXATION PELVIS 2. SYMPTOMATIC HARDWARE RIGHT KNEE  PROCEDURE:  Procedure(s): 1. REMOVAL EXTERNAL FIXATION PELVIS (Bilateral) 2. CURETTAGE ULCERATED PIN SITES TO BONE 3. STRESS FLOURO OF PELVIC RING, ANTERIOR AND POSTERIOR 4. HARDWARE REMOVAL RIGHT KNEE  (Right)   SURGEON:  Surgeon(s) and Role:    * Myrene Galas, MD - Primary  PHYSICIAN ASSISTANT: Montez Morita, PA-C  ANESTHESIA:   general  I/O:     SPECIMEN:  No Specimen  TOURNIQUET:  * No tourniquets in log *  DICTATION: .Other Dictation: Dictation Number [id number]

## 2014-12-22 NOTE — Anesthesia Preprocedure Evaluation (Addendum)
Anesthesia Evaluation  Patient identified by MRN, date of birth, ID band Patient awake    Reviewed: Allergy & Precautions, NPO status , Patient's Chart, lab work & pertinent test results  Airway Mallampati: I  TM Distance: >3 FB Neck ROM: Full    Dental  (+) Teeth Intact, Dental Advisory Given   Pulmonary neg pulmonary ROS,  breath sounds clear to auscultation        Cardiovascular negative cardio ROS  Rhythm:Regular Rate:Normal     Neuro/Psych negative neurological ROS     GI/Hepatic negative GI ROS, Neg liver ROS,   Endo/Other  negative endocrine ROS  Renal/GU negative Renal ROS     Musculoskeletal negative musculoskeletal ROS (+)   Abdominal   Peds  Hematology negative hematology ROS (+)   Anesthesia Other Findings   Reproductive/Obstetrics                             Anesthesia Physical  Anesthesia Plan  ASA: II  Anesthesia Plan: General   Post-op Pain Management:    Induction: Intravenous  Airway Management Planned: LMA  Additional Equipment:   Intra-op Plan:   Post-operative Plan:   Informed Consent: I have reviewed the patients History and Physical, chart, labs and discussed the procedure including the risks, benefits and alternatives for the proposed anesthesia with the patient or authorized representative who has indicated his/her understanding and acceptance.   Dental advisory given  Plan Discussed with: CRNA  Anesthesia Plan Comments:         Anesthesia Quick Evaluation  

## 2014-12-22 NOTE — Anesthesia Procedure Notes (Signed)
Procedure Name: LMA Insertion Performed by: Yvonne Kendall S LMA: LMA inserted LMA Size: 4.0 Number of attempts: 1 Placement Confirmation: positive ETCO2 and breath sounds checked- equal and bilateral Tube secured with: Tape Dental Injury: Teeth and Oropharynx as per pre-operative assessment

## 2014-12-22 NOTE — Transfer of Care (Signed)
Immediate Anesthesia Transfer of Care Note  Patient: Allen Sims  Procedure(s) Performed: Procedure(s): HARDWARE REMOVAL RIGHT KNEE  (Right) REMOVAL EXTERNAL FIXATION PELVIS (Bilateral)  Patient Location: PACU  Anesthesia Type:General  Level of Consciousness: awake, alert  and oriented  Airway & Oxygen Therapy: Patient Spontanous Breathing and Patient connected to nasal cannula oxygen  Post-op Assessment: Report given to RN and Post -op Vital signs reviewed and stable  Post vital signs: Reviewed and stable  Last Vitals:  Filed Vitals:   12/22/14 0933  BP: 140/83  Pulse:   Temp: 36.7 C  Resp: 14    Complications: No apparent anesthesia complications
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
## 2014-12-22 NOTE — Progress Notes (Signed)
Dr Noreene Larsson called to eval for persistent pain 6/10 after  diklaudid and  oxycodone / will see at bedside

## 2014-12-23 ENCOUNTER — Encounter (HOSPITAL_COMMUNITY): Payer: Self-pay | Admitting: Orthopedic Surgery

## 2015-03-22 ENCOUNTER — Encounter (HOSPITAL_COMMUNITY): Payer: Self-pay | Admitting: Emergency Medicine

## 2015-03-22 ENCOUNTER — Emergency Department (HOSPITAL_COMMUNITY): Payer: Self-pay

## 2015-03-22 ENCOUNTER — Emergency Department (HOSPITAL_COMMUNITY)
Admission: EM | Admit: 2015-03-22 | Discharge: 2015-03-22 | Disposition: A | Discharge status: Home / Self Care | Payer: Self-pay | Attending: Emergency Medicine | Admitting: Emergency Medicine

## 2015-03-22 DIAGNOSIS — Z79899 Other long term (current) drug therapy: Secondary | ICD-10-CM | POA: Insufficient documentation

## 2015-03-22 DIAGNOSIS — Z87828 Personal history of other (healed) physical injury and trauma: Secondary | ICD-10-CM | POA: Insufficient documentation

## 2015-03-22 DIAGNOSIS — F419 Anxiety disorder, unspecified: Secondary | ICD-10-CM | POA: Insufficient documentation

## 2015-03-22 DIAGNOSIS — Z87442 Personal history of urinary calculi: Secondary | ICD-10-CM | POA: Insufficient documentation

## 2015-03-22 DIAGNOSIS — R1011 Right upper quadrant pain: Secondary | ICD-10-CM

## 2015-03-22 DIAGNOSIS — K279 Peptic ulcer, site unspecified, unspecified as acute or chronic, without hemorrhage or perforation: Secondary | ICD-10-CM | POA: Insufficient documentation

## 2015-03-22 LAB — CBC WITH DIFFERENTIAL/PLATELET
Basophils Absolute: 0 10*3/uL (ref 0.0–0.1)
Basophils Relative: 0 %
EOS ABS: 0.1 10*3/uL (ref 0.0–0.7)
EOS PCT: 1 %
HCT: 43.7 % (ref 39.0–52.0)
Hemoglobin: 15.4 g/dL (ref 13.0–17.0)
LYMPHS ABS: 1.6 10*3/uL (ref 0.7–4.0)
LYMPHS PCT: 13 %
MCH: 31.2 pg (ref 26.0–34.0)
MCHC: 35.2 g/dL (ref 30.0–36.0)
MCV: 88.5 fL (ref 78.0–100.0)
MONO ABS: 0.6 10*3/uL (ref 0.1–1.0)
MONOS PCT: 5 %
Neutro Abs: 10 10*3/uL — ABNORMAL HIGH (ref 1.7–7.7)
Neutrophils Relative %: 81 %
PLATELETS: 219 10*3/uL (ref 150–400)
RBC: 4.94 MIL/uL (ref 4.22–5.81)
RDW: 12.3 % (ref 11.5–15.5)
WBC: 12.4 10*3/uL — AB (ref 4.0–10.5)

## 2015-03-22 LAB — COMPREHENSIVE METABOLIC PANEL
ALT: 77 U/L — AB (ref 17–63)
ANION GAP: 9 (ref 5–15)
AST: 44 U/L — ABNORMAL HIGH (ref 15–41)
Albumin: 4.3 g/dL (ref 3.5–5.0)
Alkaline Phosphatase: 96 U/L (ref 38–126)
BUN: 12 mg/dL (ref 6–20)
CHLORIDE: 105 mmol/L (ref 101–111)
CO2: 26 mmol/L (ref 22–32)
CREATININE: 0.96 mg/dL (ref 0.61–1.24)
Calcium: 9.8 mg/dL (ref 8.9–10.3)
Glucose, Bld: 144 mg/dL — ABNORMAL HIGH (ref 65–99)
Potassium: 3.7 mmol/L (ref 3.5–5.1)
SODIUM: 140 mmol/L (ref 135–145)
Total Bilirubin: 0.5 mg/dL (ref 0.3–1.2)
Total Protein: 7.3 g/dL (ref 6.5–8.1)

## 2015-03-22 LAB — URINALYSIS, ROUTINE W REFLEX MICROSCOPIC
BILIRUBIN URINE: NEGATIVE
Glucose, UA: NEGATIVE mg/dL
HGB URINE DIPSTICK: NEGATIVE
Ketones, ur: NEGATIVE mg/dL
Leukocytes, UA: NEGATIVE
NITRITE: NEGATIVE
PROTEIN: NEGATIVE mg/dL
Specific Gravity, Urine: 1.021 (ref 1.005–1.030)
pH: 6.5 (ref 5.0–8.0)

## 2015-03-22 LAB — LIPASE, BLOOD: LIPASE: 31 U/L (ref 11–51)

## 2015-03-22 MED ORDER — ONDANSETRON HCL 4 MG PO TABS: 4.0000 mg | ORAL_TABLET | Freq: Four times a day (QID) | ORAL | Status: DC

## 2015-03-22 MED ORDER — OMEPRAZOLE 20 MG PO CPDR
20.0000 mg | DELAYED_RELEASE_CAPSULE | Freq: Two times a day (BID) | ORAL | Status: DC
Start: 2015-03-22 — End: 2015-10-23

## 2015-03-22 MED ORDER — SUCRALFATE 1 G PO TABS: 1.0000 g | ORAL_TABLET | Freq: Four times a day (QID) | ORAL | Status: DC

## 2015-03-22 MED ORDER — HYDROCODONE-ACETAMINOPHEN 5-325 MG PO TABS
1.0000 | ORAL_TABLET | Freq: Once | ORAL | Status: AC
  Administered 2015-03-22: 1 via ORAL
  Filled 2015-03-22: qty 1

## 2015-03-22 MED ORDER — HYDROMORPHONE HCL 1 MG/ML IJ SOLN
1.0000 mg | INTRAMUSCULAR | Status: DC | PRN
  Administered 2015-03-22: 1 mg via INTRAVENOUS
  Filled 2015-03-22: qty 1

## 2015-03-22 MED ORDER — SODIUM CHLORIDE 0.9 % IV SOLN
Freq: Once | INTRAVENOUS | Status: AC
  Administered 2015-03-22: 08:00:00 via INTRAVENOUS

## 2015-03-22 MED ORDER — ONDANSETRON HCL 4 MG/2ML IJ SOLN
4.0000 mg | Freq: Once | INTRAMUSCULAR | Status: AC
  Administered 2015-03-22: 4 mg via INTRAVENOUS
  Filled 2015-03-22: qty 2

## 2015-03-22 MED ORDER — KETOROLAC TROMETHAMINE 30 MG/ML IJ SOLN
30.0000 mg | Freq: Once | INTRAMUSCULAR | Status: AC
  Administered 2015-03-22: 30 mg via INTRAVENOUS
  Filled 2015-03-22: qty 1

## 2015-03-22 MED ORDER — PROMETHAZINE HCL 25 MG/ML IJ SOLN
12.5000 mg | Freq: Once | INTRAMUSCULAR | Status: AC
  Administered 2015-03-22: 12.5 mg via INTRAVENOUS
  Filled 2015-03-22: qty 1

## 2015-03-22 MED ORDER — SUCRALFATE 1 G PO TABS
1.0000 g | ORAL_TABLET | Freq: Once | ORAL | Status: AC
  Administered 2015-03-22: 1 g via ORAL
  Filled 2015-03-22: qty 1

## 2015-03-22 MED ORDER — HYDROCODONE-ACETAMINOPHEN 5-325 MG PO TABS: 2.0000 | ORAL_TABLET | ORAL | Status: DC | PRN

## 2015-03-22 MED ORDER — MORPHINE SULFATE (PF) 4 MG/ML IV SOLN
4.0000 mg | INTRAVENOUS | Status: DC | PRN
  Administered 2015-03-22: 4 mg via INTRAVENOUS
  Filled 2015-03-22: qty 1

## 2015-03-22 MED ORDER — PANTOPRAZOLE SODIUM 40 MG IV SOLR
40.0000 mg | Freq: Once | INTRAVENOUS | Status: AC
  Administered 2015-03-22: 40 mg via INTRAVENOUS
  Filled 2015-03-22: qty 40
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
## 2015-03-22 NOTE — Discharge Instructions (Signed)
Abdominal Pain, Adult °Many things can cause belly (abdominal) pain. Most times, the belly pain is not dangerous. Many cases of belly pain can be watched and treated at home. °HOME CARE  °· Do not take medicines that help you go poop (laxatives) unless told to by your doctor. °· Only take medicine as told by your doctor. °· Eat or drink as told by your doctor. Your doctor will tell you if you should be on a special diet. °GET HELP IF: °· You do not know what is causing your belly pain. °· You have belly pain while you are sick to your stomach (nauseous) or have runny poop (diarrhea). °· You have pain while you pee or poop. °· Your belly pain wakes you up at night. °· You have belly pain that gets worse or better when you eat. °· You have belly pain that gets worse when you eat fatty foods. °· You have a fever. °GET HELP RIGHT AWAY IF:  °· The pain does not go away within 2 hours. °· You keep throwing up (vomiting). °· The pain changes and is only in the right or left part of the belly. °· You have bloody or tarry looking poop. °MAKE SURE YOU:  °· Understand these instructions. °· Will watch your condition. °· Will get help right away if you are not doing well or get worse. °  °This information is not intended to replace advice given to you by your health care provider. Make sure you discuss any questions you have with your health care provider. °  °Document Released: 10/02/2007 Document Revised: 05/06/2014 Document Reviewed: 12/23/2012 °Elsevier Interactive Patient Education ©2016 [employer] °Peptic Ulcer °A peptic ulcer is a sore in the lining of your esophagus (esophageal ulcer), stomach (gastric ulcer), or in the first part of your small intestine (duodenal ulcer). The ulcer causes erosion into the deeper tissue. °CAUSES  °Normally, the lining of the stomach and the small intestine protects itself from the acid that digests food. The protective lining can be damaged by: °· An infection caused by a bacterium  called Helicobacter pylori (H. pylori). °· Regular use of nonsteroidal anti-inflammatory drugs (NSAIDs), such as ibuprofen or aspirin. °· Smoking tobacco. °Other risk factors include being older than 50, drinking alcohol excessively, and having a family history of ulcer disease.  °SYMPTOMS  °· Burning pain or gnawing in the area between the chest and the belly button. °· Heartburn. °· Nausea and vomiting. °· Bloating. °The pain can be worse on an empty stomach and at night. If the ulcer results in bleeding, it can cause: °· Black, tarry stools. °· Vomiting of bright red blood. °· Vomiting of coffee-ground-looking materials. °DIAGNOSIS  °A diagnosis is usually made based upon your history and an exam. Other tests and procedures may be performed to find the cause of the ulcer. Finding a cause will help determine the best treatment. Tests and procedures may include: °· Blood tests, stool tests, or breath tests to check for the bacterium H. pylori. °· An upper gastrointestinal (GI) series of the esophagus, stomach, and small intestine. °· An endoscopy to examine the esophagus, stomach, and small intestine. °· A biopsy. °TREATMENT  °Treatment may include: °· Eliminating the cause of the ulcer, such as smoking, NSAIDs, or alcohol. °· Medicines to reduce the amount of acid in your digestive tract. °· Antibiotic medicines if the ulcer is caused by the H. pylori bacterium. °· An upper endoscopy to treat a bleeding ulcer. °· Surgery if the   bleeding is severe or if the ulcer created a hole somewhere in the digestive system. °HOME CARE INSTRUCTIONS  °· Avoid tobacco, alcohol, and caffeine. Smoking can increase the acid in the stomach, and continued smoking will impair the healing of ulcers. °· Avoid foods and drinks that seem to cause discomfort or aggravate your ulcer. °· Only take medicines as directed by your caregiver. Do not substitute over-the-counter medicines for prescription medicines without talking to your  caregiver. °· Keep any follow-up appointments and tests as directed. °SEEK MEDICAL CARE IF:  °· Your do not improve within 7 days of starting treatment. °· You have ongoing indigestion or heartburn. °SEEK IMMEDIATE MEDICAL CARE IF:  °· You have sudden, sharp, or persistent abdominal pain. °· You have bloody or dark black, tarry stools. °· You vomit blood or vomit that looks like coffee grounds. °· You become light-headed, weak, or feel faint. °· You become sweaty or clammy. °MAKE SURE YOU:  °· Understand these instructions. °· Will watch your condition. °· Will get help right away if you are not doing well or get worse. °  °This information is not intended to replace advice given to you by your health care provider. Make sure you discuss any questions you have with your health care provider. °  °Document Released: 04/12/2000 Document Revised: 05/06/2014 Document Reviewed: 11/13/2011 °Elsevier Interactive Patient Education ©2016 Elsevier Inc. ° °

## 2015-03-22 NOTE — ED Notes (Signed)
Pt started having CP that moved into right flank pain at 65M. Pt states it is severe pain. Pt feels SOB, having N/V as well.

## 2015-03-29 ENCOUNTER — Encounter: Payer: Self-pay | Admitting: *Deleted

## 2015-03-31 ENCOUNTER — Ambulatory Visit: Payer: Self-pay | Admitting: Internal Medicine

## 2015-05-19 ENCOUNTER — Encounter: Payer: Self-pay | Admitting: *Deleted

## 2015-05-22 ENCOUNTER — Ambulatory Visit: Payer: Worker's Compensation | Admitting: Anesthesiology

## 2015-05-22 ENCOUNTER — Ambulatory Visit
Admission: RE | Admit: 2015-05-22 | Discharge: 2015-05-22 | Disposition: A | Discharge status: Home / Self Care | Payer: Worker's Compensation | Source: Ambulatory Visit | Attending: Gastroenterology | Admitting: Gastroenterology

## 2015-05-22 ENCOUNTER — Encounter
Admission: RE | Disposition: A | Discharge status: Home / Self Care | Payer: Self-pay | Source: Ambulatory Visit | Attending: Gastroenterology

## 2015-05-22 ENCOUNTER — Encounter: Payer: Self-pay | Admitting: Anesthesiology

## 2015-05-22 DIAGNOSIS — K2980 Duodenitis without bleeding: Secondary | ICD-10-CM | POA: Insufficient documentation

## 2015-05-22 DIAGNOSIS — J45909 Unspecified asthma, uncomplicated: Secondary | ICD-10-CM | POA: Insufficient documentation

## 2015-05-22 DIAGNOSIS — Z881 Allergy status to other antibiotic agents status: Secondary | ICD-10-CM | POA: Diagnosis not present

## 2015-05-22 DIAGNOSIS — K2950 Unspecified chronic gastritis without bleeding: Secondary | ICD-10-CM | POA: Diagnosis not present

## 2015-05-22 DIAGNOSIS — Z79899 Other long term (current) drug therapy: Secondary | ICD-10-CM | POA: Insufficient documentation

## 2015-05-22 DIAGNOSIS — R1013 Epigastric pain: Secondary | ICD-10-CM | POA: Diagnosis present

## 2015-05-22 DIAGNOSIS — F419 Anxiety disorder, unspecified: Secondary | ICD-10-CM | POA: Diagnosis not present

## 2015-05-22 HISTORY — DX: Personal history of (healed) traumatic fracture: Z87.81

## 2015-05-22 HISTORY — DX: Unspecified asthma, uncomplicated: J45.909

## 2015-05-22 HISTORY — PX: ESOPHAGOGASTRODUODENOSCOPY (EGD) WITH PROPOFOL: SHX5813

## 2015-05-22 SURGERY — ESOPHAGOGASTRODUODENOSCOPY (EGD) WITH PROPOFOL
Anesthesia: General

## 2015-05-22 MED ORDER — FENTANYL CITRATE (PF) 100 MCG/2ML IJ SOLN
INTRAMUSCULAR | Status: DC | PRN
  Administered 2015-05-22 (×2): 50 ug via INTRAVENOUS

## 2015-05-22 MED ORDER — PROPOFOL 500 MG/50ML IV EMUL
INTRAVENOUS | Status: DC | PRN
  Administered 2015-05-22: 10 ug/kg/min via INTRAVENOUS

## 2015-05-22 MED ORDER — GLYCOPYRROLATE 0.2 MG/ML IJ SOLN
INTRAMUSCULAR | Status: DC | PRN
  Administered 2015-05-22: 0.1 mg via INTRAVENOUS

## 2015-05-22 MED ORDER — MIDAZOLAM HCL 2 MG/2ML IJ SOLN
INTRAMUSCULAR | Status: DC | PRN
  Administered 2015-05-22: 2 mg via INTRAVENOUS

## 2015-05-22 MED ORDER — SODIUM CHLORIDE 0.9 % IV SOLN
INTRAVENOUS | Status: DC
  Administered 2015-05-22: 11:00:00 via INTRAVENOUS

## 2015-05-22 NOTE — Transfer of Care (Signed)
Immediate Anesthesia Transfer of Care Note  Patient: Allen Sims  Procedure(s) Performed: Procedure(s): ESOPHAGOGASTRODUODENOSCOPY (EGD) WITH PROPOFOL (N/A)  Patient Location: PACU  Anesthesia Type:General  Level of Consciousness: awake, alert , oriented and sedated  Airway & Oxygen Therapy: Patient Spontanous Breathing and Patient connected to nasal cannula oxygen  Post-op Assessment: Report given to RN and Post -op Vital signs reviewed and stable  Post vital signs: Reviewed and stable  Last Vitals:  Filed Vitals:   05/22/15 1103  BP: 128/87  Pulse: 74  Temp: 36.7 C  Resp: 17    Complications: No apparent anesthesia complications

## 2015-05-22 NOTE — Discharge Instructions (Signed)

## 2015-05-22 NOTE — Anesthesia Postprocedure Evaluation (Signed)
Anesthesia Post Note  Patient: AIMAN NOE  Procedure(s) Performed: Procedure(s) (LRB): ESOPHAGOGASTRODUODENOSCOPY (EGD) WITH PROPOFOL (N/A)  Patient location during evaluation: PACU Anesthesia Type: General Level of consciousness: awake and alert and oriented Pain management: pain level controlled Vital Signs Assessment: post-procedure vital signs reviewed and stable Respiratory status: spontaneous breathing Cardiovascular status: blood pressure returned to baseline Anesthetic complications: no    Last Vitals:  Filed Vitals:   05/22/15 1220 05/22/15 1230  BP: 94/75 90/73  Pulse: 88 74  Temp:    Resp: 17 13    Last Pain: There were no vitals filed for this visit.               Dorean Daniello
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
## 2015-05-22 NOTE — Anesthesia Procedure Notes (Signed)
Performed by: COOK-MARTIN, Kedric Bumgarner Pre-anesthesia Checklist: Emergency Drugs available, Patient identified, Suction available, Patient being monitored and Timeout performed Patient Re-evaluated:Patient Re-evaluated prior to inductionOxygen Delivery Method: Nasal cannula Preoxygenation: Pre-oxygenation with 100% oxygen Intubation Type: IV induction Placement Confirmation: positive ETCO2 and CO2 detector     

## 2015-05-22 NOTE — Op Note (Signed)
[facility] Patient Name: Allen Sims Procedure Date: 05/22/2015 11:33 AM MRN: [id number] Account #: [id number] Date of Birth: 1990/08/30 Admit Type: Outpatient Age: 25 Room: [facility] ENDO ROOM 2 Gender: Male Note Status: Finalized Procedure:         Upper GI endoscopy Indications:       Epigastric abdominal pain Patient Profile:   This is a 25 year old male. Providers:         Rhona Raider. Shelle Iron, MD Medicines:         Propofol per Anesthesia Complications:     No immediate complications. Procedure:         Pre-Anesthesia Assessment:                    - Prior to the procedure, a History and Physical was                     performed, and patient medications, allergies and                     sensitivities were reviewed. The patient's tolerance of                     previous anesthesia was reviewed.                    After obtaining informed consent, the endoscope was passed                     under direct vision. Throughout the procedure, the                     patient's blood pressure, pulse, and oxygen saturations                     were monitored continuously. The Olympus GIF-160 endoscope                     (S#. [id number]) was introduced through the mouth, and                     advanced to the second part of duodenum. The upper GI                     endoscopy was accomplished without difficulty. The patient                     tolerated the procedure well. Findings:      The esophagus was normal.      Localized severe inflammation characterized by congestion (edema),       erythema and granularity was found in the cardia. Biopsies were taken       with a cold forceps for histology.      Segmental moderate mucosal changes characterized by discoloration and       flattening were found in the second part of the duodenum. Biopsies were       taken with a cold forceps for histology. Impression:        - Normal esophagus.  - Gastritis. Biopsied.                    - Mucosal changes in the duodenum. Biopsied. Recommendation:    - Observe patient in GI recovery unit.                    -  Resume regular diet.                    - Continue present medications.                    - Await pathology results.                    - Use Protonix (pantoprazole) 40 mg PO daily.                    - Avoid nsaids                    - The findings and recommendations were discussed with the                     patient.                    - The findings and recommendations were discussed with the                     patient's family. Procedure Code(s): --- Professional ---                    [phone], Esophagogastroduodenoscopy, flexible, transoral;                     with biopsy, single or multiple Diagnosis Code(s): --- Professional ---                    K29.70, Gastritis, unspecified, without bleeding                    K31.89, Other diseases of stomach and duodenum                    R10.13, Epigastric pain CPT copyright 2014 American Medical Association. All rights reserved. The codes documented in this report are preliminary and upon coder review may  be revised to meet current compliance requirements. Kathalene Frames, MD 05/22/2015 11:58:15 AM This report has been signed electronically. Number of Addenda: 0 Note Initiated On: 05/22/2015 11:33 AM      [facility]
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
## 2015-05-23 LAB — SURGICAL PATHOLOGY

## 2015-05-24 ENCOUNTER — Encounter: Payer: Self-pay | Admitting: Gastroenterology

## 2015-05-30 ENCOUNTER — Other Ambulatory Visit: Payer: Self-pay | Admitting: General Surgery

## 2015-05-30 DIAGNOSIS — R1032 Left lower quadrant pain: Secondary | ICD-10-CM

## 2015-06-02 ENCOUNTER — Ambulatory Visit
Admission: RE | Admit: 2015-06-02 | Discharge: 2015-06-02 | Disposition: A | Discharge status: Home / Self Care | Payer: Worker's Compensation | Source: Ambulatory Visit | Attending: General Surgery | Admitting: General Surgery

## 2015-06-02 DIAGNOSIS — R1032 Left lower quadrant pain: Secondary | ICD-10-CM

## 2015-06-02 MED ORDER — IOPAMIDOL (ISOVUE-300) INJECTION 61%
100.0000 mL | Freq: Once | INTRAVENOUS | Status: AC | PRN
  Administered 2015-06-02: 100 mL via INTRAVENOUS

## 2015-06-26 ENCOUNTER — Ambulatory Visit: Payer: Self-pay | Admitting: General Surgery

## 2015-07-18 ENCOUNTER — Inpatient Hospital Stay (HOSPITAL_COMMUNITY)
Admission: RE | Admit: 2015-07-18 | Discharge: 2015-07-18 | Disposition: A | Discharge status: Home / Self Care | Payer: Self-pay | Source: Ambulatory Visit

## 2015-07-20 ENCOUNTER — Inpatient Hospital Stay (HOSPITAL_COMMUNITY): Admission: RE | Admit: 2015-07-20 | Payer: Self-pay | Source: Ambulatory Visit

## 2015-07-24 ENCOUNTER — Encounter (HOSPITAL_COMMUNITY): Payer: Self-pay | Admitting: *Deleted

## 2015-07-24 MED ORDER — CIPROFLOXACIN IN D5W 400 MG/200ML IV SOLN
400.0000 mg | INTRAVENOUS | Status: AC
  Administered 2015-07-25: 400 mg via INTRAVENOUS
  Filled 2015-07-24: qty 200

## 2015-07-24 NOTE — Anesthesia Preprocedure Evaluation (Addendum)
Anesthesia Evaluation  Patient identified by MRN, date of birth, ID band Patient awake    Reviewed: Allergy & Precautions, NPO status , Patient's Chart, lab work & pertinent test results  History of Anesthesia Complications Negative for: history of anesthetic complications  Airway Mallampati: I  TM Distance: >3 FB Neck ROM: Full    Dental  (+) Teeth Intact, Dental Advisory Given   Pulmonary neg pulmonary ROS,    breath sounds clear to auscultation       Cardiovascular negative cardio ROS   Rhythm:Regular Rate:Normal     Neuro/Psych PSYCHIATRIC DISORDERS (PTSD) Anxiety Chronic pain from complex pelvic fracture    GI/Hepatic Neg liver ROS, GERD  Medicated and Controlled,  Endo/Other  negative endocrine ROS  Renal/GU negative Renal ROS     Musculoskeletal   Abdominal   Peds  Hematology negative hematology ROS (+)   Anesthesia Other Findings   Reproductive/Obstetrics                           Anesthesia Physical Anesthesia Plan  ASA: II  Anesthesia Plan: General   Post-op Pain Management: GA combined w/ Regional for post-op pain   Induction: Intravenous  Airway Management Planned: Oral ETT  Additional Equipment:   Intra-op Plan:   Post-operative Plan: Extubation in OR  Informed Consent: I have reviewed the patients History and Physical, chart, labs and discussed the procedure including the risks, benefits and alternatives for the proposed anesthesia with the patient or authorized representative who has indicated his/her understanding and acceptance.   Dental advisory given  Plan Discussed with: CRNA and Surgeon  Anesthesia Plan Comments: (Plan routine monitors, GETA with TAP block for post op analgesia)        Anesthesia Quick Evaluation

## 2015-07-25 ENCOUNTER — Encounter (HOSPITAL_COMMUNITY)
Admission: RE | Disposition: A | Discharge status: Home / Self Care | Payer: Self-pay | Source: Ambulatory Visit | Attending: General Surgery

## 2015-07-25 ENCOUNTER — Ambulatory Visit (HOSPITAL_COMMUNITY): Payer: Worker's Compensation | Admitting: Anesthesiology

## 2015-07-25 ENCOUNTER — Ambulatory Visit (HOSPITAL_COMMUNITY)
Admission: RE | Admit: 2015-07-25 | Discharge: 2015-07-25 | Disposition: A | Discharge status: Home / Self Care | Payer: Worker's Compensation | Source: Ambulatory Visit | Attending: General Surgery | Admitting: General Surgery

## 2015-07-25 ENCOUNTER — Encounter (HOSPITAL_COMMUNITY): Payer: Self-pay | Admitting: Certified Registered Nurse Anesthetist

## 2015-07-25 DIAGNOSIS — G8929 Other chronic pain: Secondary | ICD-10-CM | POA: Diagnosis not present

## 2015-07-25 DIAGNOSIS — K4090 Unilateral inguinal hernia, without obstruction or gangrene, not specified as recurrent: Secondary | ICD-10-CM | POA: Insufficient documentation

## 2015-07-25 HISTORY — PX: INSERTION OF MESH: SHX5868

## 2015-07-25 HISTORY — PX: INGUINAL HERNIA REPAIR: SHX194

## 2015-07-25 HISTORY — DX: Polyneuropathy, unspecified: G62.9

## 2015-07-25 HISTORY — DX: Post-traumatic stress disorder, unspecified: F43.10

## 2015-07-25 HISTORY — DX: Personal history of other diseases of urinary system: Z87.448

## 2015-07-25 LAB — BASIC METABOLIC PANEL
ANION GAP: 11 (ref 5–15)
BUN: 10 mg/dL (ref 6–20)
CO2: 23 mmol/L (ref 22–32)
Calcium: 9.5 mg/dL (ref 8.9–10.3)
Chloride: 109 mmol/L (ref 101–111)
Creatinine, Ser: 1.17 mg/dL (ref 0.61–1.24)
GLUCOSE: 96 mg/dL (ref 65–99)
POTASSIUM: 4.2 mmol/L (ref 3.5–5.1)
SODIUM: 143 mmol/L (ref 135–145)

## 2015-07-25 LAB — CBC WITH DIFFERENTIAL/PLATELET
BASOS ABS: 0.1 10*3/uL (ref 0.0–0.1)
BASOS PCT: 0 %
Eosinophils Absolute: 0.3 10*3/uL (ref 0.0–0.7)
Eosinophils Relative: 3 %
HEMATOCRIT: 45.5 % (ref 39.0–52.0)
HEMOGLOBIN: 15.8 g/dL (ref 13.0–17.0)
LYMPHS PCT: 21 %
Lymphs Abs: 2.4 10*3/uL (ref 0.7–4.0)
MCH: 31.1 pg (ref 26.0–34.0)
MCHC: 34.7 g/dL (ref 30.0–36.0)
MCV: 89.6 fL (ref 78.0–100.0)
Monocytes Absolute: 1 10*3/uL (ref 0.1–1.0)
Monocytes Relative: 9 %
NEUTROS ABS: 7.4 10*3/uL (ref 1.7–7.7)
NEUTROS PCT: 67 %
Platelets: 196 10*3/uL (ref 150–400)
RBC: 5.08 MIL/uL (ref 4.22–5.81)
RDW: 12.3 % (ref 11.5–15.5)
WBC: 11.1 10*3/uL — AB (ref 4.0–10.5)

## 2015-07-25 SURGERY — REPAIR, HERNIA, INGUINAL, ADULT
Anesthesia: General | Site: Inguinal | Laterality: Left

## 2015-07-25 MED ORDER — FENTANYL CITRATE (PF) 100 MCG/2ML IJ SOLN
INTRAMUSCULAR | Status: DC | PRN
  Administered 2015-07-25: 250 ug via INTRAVENOUS
  Administered 2015-07-25: 100 ug via INTRAVENOUS
  Administered 2015-07-25 (×6): 50 ug via INTRAVENOUS

## 2015-07-25 MED ORDER — OXYCODONE-ACETAMINOPHEN 10-325 MG PO TABS: 1.0000 | ORAL_TABLET | ORAL | Status: DC | PRN

## 2015-07-25 MED ORDER — FENTANYL CITRATE (PF) 250 MCG/5ML IJ SOLN
INTRAMUSCULAR | Status: AC
  Filled 2015-07-25: qty 5

## 2015-07-25 MED ORDER — ROCURONIUM BROMIDE 100 MG/10ML IV SOLN
INTRAVENOUS | Status: DC | PRN
  Administered 2015-07-25: 40 mg via INTRAVENOUS

## 2015-07-25 MED ORDER — ONDANSETRON HCL 4 MG/2ML IJ SOLN
INTRAMUSCULAR | Status: AC
  Filled 2015-07-25: qty 2

## 2015-07-25 MED ORDER — BUPIVACAINE HCL (PF) 0.25 % IJ SOLN
INTRAMUSCULAR | Status: AC
  Filled 2015-07-25: qty 30

## 2015-07-25 MED ORDER — NEOSTIGMINE METHYLSULFATE 10 MG/10ML IV SOLN
INTRAVENOUS | Status: AC
  Filled 2015-07-25: qty 5

## 2015-07-25 MED ORDER — LIDOCAINE HCL (CARDIAC) 20 MG/ML IV SOLN
INTRAVENOUS | Status: AC
  Filled 2015-07-25: qty 10

## 2015-07-25 MED ORDER — LIDOCAINE HCL (CARDIAC) 20 MG/ML IV SOLN
INTRAVENOUS | Status: DC | PRN
  Administered 2015-07-25: 20 mg via INTRAVENOUS

## 2015-07-25 MED ORDER — OXYCODONE-ACETAMINOPHEN 5-325 MG PO TABS
ORAL_TABLET | ORAL | Status: AC
  Filled 2015-07-25: qty 1

## 2015-07-25 MED ORDER — NEOSTIGMINE METHYLSULFATE 10 MG/10ML IV SOLN
INTRAVENOUS | Status: DC | PRN
  Administered 2015-07-25: 3 mg via INTRAVENOUS

## 2015-07-25 MED ORDER — OXYCODONE HCL 5 MG PO TABS
ORAL_TABLET | ORAL | Status: AC
  Filled 2015-07-25: qty 1

## 2015-07-25 MED ORDER — HYDROMORPHONE HCL 1 MG/ML IJ SOLN
INTRAMUSCULAR | Status: AC
  Administered 2015-07-25: 0.5 mg via INTRAVENOUS
  Filled 2015-07-25: qty 1

## 2015-07-25 MED ORDER — ONDANSETRON HCL 4 MG/2ML IJ SOLN
INTRAMUSCULAR | Status: DC | PRN
  Administered 2015-07-25: 4 mg via INTRAVENOUS

## 2015-07-25 MED ORDER — GLYCOPYRROLATE 0.2 MG/ML IJ SOLN
INTRAMUSCULAR | Status: DC | PRN
  Administered 2015-07-25: 0.4 mg via INTRAVENOUS

## 2015-07-25 MED ORDER — MEPERIDINE HCL 25 MG/ML IJ SOLN: 6.2500 mg | INTRAMUSCULAR | Status: DC | PRN

## 2015-07-25 MED ORDER — MIDAZOLAM HCL 5 MG/5ML IJ SOLN
INTRAMUSCULAR | Status: DC | PRN
  Administered 2015-07-25: 2 mg via INTRAVENOUS

## 2015-07-25 MED ORDER — HYDROMORPHONE HCL 1 MG/ML IJ SOLN
0.2500 mg | INTRAMUSCULAR | Status: DC | PRN
  Administered 2015-07-25 (×2): 0.5 mg via INTRAVENOUS

## 2015-07-25 MED ORDER — PROPOFOL 10 MG/ML IV BOLUS
INTRAVENOUS | Status: AC
  Filled 2015-07-25: qty 40

## 2015-07-25 MED ORDER — LACTATED RINGERS IV SOLN
INTRAVENOUS | Status: DC | PRN
  Administered 2015-07-25 (×2): via INTRAVENOUS

## 2015-07-25 MED ORDER — OXYCODONE HCL 5 MG PO TABS
5.0000 mg | ORAL_TABLET | Freq: Once | ORAL | Status: AC
  Administered 2015-07-25: 5 mg via ORAL

## 2015-07-25 MED ORDER — ROCURONIUM BROMIDE 50 MG/5ML IV SOLN
INTRAVENOUS | Status: AC
  Filled 2015-07-25: qty 1

## 2015-07-25 MED ORDER — 0.9 % SODIUM CHLORIDE (POUR BTL) OPTIME
TOPICAL | Status: DC | PRN
  Administered 2015-07-25: 1000 mL

## 2015-07-25 MED ORDER — BUPIVACAINE-EPINEPHRINE (PF) 0.5% -1:200000 IJ SOLN
INTRAMUSCULAR | Status: DC | PRN
  Administered 2015-07-25: 30 mL

## 2015-07-25 MED ORDER — MIDAZOLAM HCL 2 MG/2ML IJ SOLN
INTRAMUSCULAR | Status: AC
  Filled 2015-07-25: qty 2

## 2015-07-25 MED ORDER — PROPOFOL 10 MG/ML IV BOLUS
INTRAVENOUS | Status: DC | PRN
  Administered 2015-07-25: 200 mg via INTRAVENOUS

## 2015-07-25 MED ORDER — SUCCINYLCHOLINE CHLORIDE 20 MG/ML IJ SOLN
INTRAMUSCULAR | Status: AC
  Filled 2015-07-25: qty 1

## 2015-07-25 MED ORDER — MIDAZOLAM HCL 2 MG/2ML IJ SOLN: 0.5000 mg | Freq: Once | INTRAMUSCULAR | Status: DC | PRN

## 2015-07-25 MED ORDER — OXYCODONE-ACETAMINOPHEN 5-325 MG PO TABS
1.0000 | ORAL_TABLET | Freq: Once | ORAL | Status: AC
  Administered 2015-07-25: 1 via ORAL

## 2015-07-25 MED ORDER — SODIUM CHLORIDE 0.9 % IR SOLN
Status: DC | PRN
  Administered 2015-07-25: 500 mL

## 2015-07-25 MED ORDER — GLYCOPYRROLATE 0.2 MG/ML IJ SOLN
INTRAMUSCULAR | Status: AC
  Filled 2015-07-25: qty 2

## 2015-07-25 SURGICAL SUPPLY — 57 items
ADH SKN CLS APL DERMABOND .7 (GAUZE/BANDAGES/DRESSINGS) ×1
BAG DECANTER FOR FLEXI CONT (MISCELLANEOUS) ×3 IMPLANT
BLADE SURG 10 STRL SS (BLADE) ×3 IMPLANT
BLADE SURG 15 STRL LF DISP TIS (BLADE) ×1 IMPLANT
BLADE SURG 15 STRL SS (BLADE) ×2
BLADE SURG ROTATE 9660 (MISCELLANEOUS) ×3 IMPLANT
CANISTER SUCTION 2500CC (MISCELLANEOUS) ×3 IMPLANT
CHLORAPREP W/TINT 26ML (MISCELLANEOUS) ×3 IMPLANT
CLEANER TIP ELECTROSURG 2X2 (MISCELLANEOUS) ×3 IMPLANT
CLOSURE STERI-STRIP 1/2X4 (GAUZE/BANDAGES/DRESSINGS) ×1
CLOSURE WOUND 1/2 X4 (GAUZE/BANDAGES/DRESSINGS) ×1
CLSR STERI-STRIP ANTIMIC 1/2X4 (GAUZE/BANDAGES/DRESSINGS) ×2 IMPLANT
COVER SURGICAL LIGHT HANDLE (MISCELLANEOUS) ×3 IMPLANT
DERMABOND ADVANCED (GAUZE/BANDAGES/DRESSINGS) ×2
DERMABOND ADVANCED .7 DNX12 (GAUZE/BANDAGES/DRESSINGS) ×1 IMPLANT
DRAIN PENROSE 1/2X12 LTX STRL (WOUND CARE) ×3 IMPLANT
DRAPE LAPAROTOMY TRNSV 102X78 (DRAPE) ×3 IMPLANT
DRAPE UTILITY XL STRL (DRAPES) ×3 IMPLANT
DRSG TEGADERM 4X4.75 (GAUZE/BANDAGES/DRESSINGS) ×3 IMPLANT
ELECT REM PT RETURN 9FT ADLT (ELECTROSURGICAL) ×3
ELECTRODE REM PT RTRN 9FT ADLT (ELECTROSURGICAL) ×1 IMPLANT
GAUZE SPONGE 4X4 16PLY XRAY LF (GAUZE/BANDAGES/DRESSINGS) ×3 IMPLANT
GLOVE BIO SURGEON STRL SZ7.5 (GLOVE) ×3 IMPLANT
GLOVE BIOGEL PI IND STRL 7.5 (GLOVE) ×1 IMPLANT
GLOVE BIOGEL PI IND STRL 8 (GLOVE) ×1 IMPLANT
GLOVE BIOGEL PI INDICATOR 7.5 (GLOVE) ×2
GLOVE BIOGEL PI INDICATOR 8 (GLOVE) ×2
GLOVE ECLIPSE 7.5 STRL STRAW (GLOVE) ×3 IMPLANT
GLOVE SURG SS PI 7.0 STRL IVOR (GLOVE) ×3 IMPLANT
GOWN STRL REUS W/ TWL LRG LVL3 (GOWN DISPOSABLE) ×2 IMPLANT
GOWN STRL REUS W/TWL LRG LVL3 (GOWN DISPOSABLE) ×4
KIT BASIN OR (CUSTOM PROCEDURE TRAY) ×3 IMPLANT
KIT ROOM TURNOVER OR (KITS) ×3 IMPLANT
LIQUID BAND (GAUZE/BANDAGES/DRESSINGS) IMPLANT
MESH HERNIA 3X6 (Mesh General) ×3 IMPLANT
NEEDLE HYPO 25GX1X1/2 BEV (NEEDLE) ×3 IMPLANT
NS IRRIG 1000ML POUR BTL (IV SOLUTION) ×3 IMPLANT
PACK SURGICAL SETUP 50X90 (CUSTOM PROCEDURE TRAY) ×3 IMPLANT
PAD ARMBOARD 7.5X6 YLW CONV (MISCELLANEOUS) ×3 IMPLANT
PENCIL BUTTON HOLSTER BLD 10FT (ELECTRODE) ×3 IMPLANT
SPECIMEN JAR SMALL (MISCELLANEOUS) ×3 IMPLANT
SPONGE INTESTINAL PEANUT (DISPOSABLE) ×3 IMPLANT
SPONGE LAP 18X18 X RAY DECT (DISPOSABLE) ×3 IMPLANT
STRIP CLOSURE SKIN 1/2X4 (GAUZE/BANDAGES/DRESSINGS) ×2 IMPLANT
SUT ETHIBOND 0 MO6 C/R (SUTURE) ×3 IMPLANT
SUT MON AB 4-0 PC3 18 (SUTURE) ×3 IMPLANT
SUT PROLENE 0 CT 2 (SUTURE) ×6 IMPLANT
SUT VIC AB 3-0 SH 27 (SUTURE) ×4
SUT VIC AB 3-0 SH 27X BRD (SUTURE) ×2 IMPLANT
SUT VICRYL AB 3 0 TIES (SUTURE) ×3 IMPLANT
SYR BULB 3OZ (MISCELLANEOUS) ×3 IMPLANT
SYR CONTROL 10ML LL (SYRINGE) ×3 IMPLANT
TOWEL OR 17X24 6PK STRL BLUE (TOWEL DISPOSABLE) ×3 IMPLANT
TOWEL OR 17X26 10 PK STRL BLUE (TOWEL DISPOSABLE) ×3 IMPLANT
TUBE CONNECTING 12'X1/4 (SUCTIONS)
TUBE CONNECTING 12X1/4 (SUCTIONS) IMPLANT
YANKAUER SUCT BULB TIP NO VENT (SUCTIONS) IMPLANT

## 2015-07-25 NOTE — Anesthesia Procedure Notes (Addendum)
Anesthesia Regional Block:  TAP block  Pre-Anesthetic Checklist: ,, timeout performed, Correct Patient, Correct Site, Correct Laterality, Correct Procedure, Correct Position, site marked, Risks and benefits discussed,  Surgical consent,  Pre-op evaluation,  At surgeon's request and post-op pain management  Laterality: Left  Prep: chloraprep       Needles:  Injection technique: Single-shot  Needle Type: Echogenic Needle     Needle Length: 9cm 9 cm Needle Gauge: 22 and 22 G    Additional Needles:  Procedures: ultrasound guided (picture in chart) TAP block Narrative:  Start time: 07/25/2015 7:20 AM End time: 07/25/2015 7:29 AM Injection made incrementally with aspirations every 5 mL.  Performed by: Personally  Anesthesiologist: Jean RosenthalJACKSON, CARSWELL  Additional Notes: Pt identified in Holding room.  Monitors applied. Working IV access confirmed. Sterile prep, drape L flank.  #22ga ECHOgenic needle into TAP with US guidance.  30cc 0.5% Bupivacaine with 1:200k epi injected incrementally after negative test dose, good spread of local.  Patient asymptomatic, VSS, no heme aspirated, tolerated well.  Sandford Craze Jackson, MD   Procedure Name: Intubation Date/Time: 07/25/2015 7:50 AM Performed by: Rise PatienceBELL, Cullen Vanallen T Pre-anesthesia Checklist: Patient identified, Emergency Drugs available, Suction available and Patient being monitored Patient Re-evaluated:Patient Re-evaluated prior to inductionOxygen Delivery Method: Circle System Utilized Preoxygenation: Pre-oxygenation with 100% oxygen Intubation Type: IV induction Ventilation: Mask ventilation without difficulty Laryngoscope Size: Miller and 2 Grade View: Grade I Tube type: Oral Tube size: 7.5 mm Number of attempts: 1 Airway Equipment and Method: Stylet Placement Confirmation: ETT inserted through vocal cords under direct vision,  positive ETCO2 and breath sounds checked- equal and bilateral Secured at: 22 cm Tube secured with: Tape Dental Injury:  Teeth and Oropharynx as per pre-operative assessment

## 2015-07-25 NOTE — H&P (Signed)
  Allen Sims 06/26/2015 11:25 AM Location: [facility] Patient #: [id number] DOB: 01/06/1991 Single / Language: [place] / Race: White Male   History of Present Illness (Ammie Eversole LPN; 0/45/40982/27/2017 11:9111:26 AM) Patient words: follow up.  The patient is a 25 year old male.   Allergies (Ammie Eversole, LPN; 4/78/29562/27/2017 21:3011:27 AM) Ceclor *CEPHALOSPORINS*  Medication History (Ammie Eversole, LPN; 8/65/78462/27/2017 96:2911:27 AM) Omeprazole (20MG  Capsule DR, Oral) Active. Lyrica (150MG  Capsule, Oral) Active. Venlafaxine HCl ER (37.5MG  Capsule ER 24HR, Oral) Active. Medications Reconciled  Vitals (Ammie Eversole LPN; 5/28/41322/27/2017 44:0111:26 AM) 06/26/2015 11:26 AM Weight: 200.4 lb Height: 69in Body Surface Area: 2.07 m Body Mass Index: 29.59 kg/m  Temp.: 1F(Oral)  Pulse: 82 (Regular)  BP: 122/76 (Sitting, Left Arm, Standard) Vitals today are stable. Physical Exam Allen Sims(Alizea Pell O. Sheree Lalla MD; 06/26/2015 11:49 AM) Lungs: Clear to auscultation Cardiac:  RRR, no murmurs Abdomen:  Soft, non-tender, good bowel sounds Inspection Hernias - Inguinal hernia - Left - Reducible(very small and easily reducible, seen on CT and contains fat only.).   Assessment & Plan Allen Sims([name] MD; 06/26/2015 11:52 AM) LEFT INGUINAL HERNIA (K40.90) Impression: CT scan confirms the presence of a very samll left inguinalhernia with fat only.l No bowel in the hernia. Still very symptomatic andhe wants it fixed. The surgery was explained to the patient. He wishes to proceed.  Marta LamasJames O. Gae BonWyatt, [name], MD, FACS [phone]--pager [phone]--[facility]

## 2015-07-25 NOTE — Transfer of Care (Signed)
Immediate Anesthesia Transfer of Care Note  Patient: Allen Sims  Procedure(s) Performed: Procedure(s): OPEN LEFT INGUINAL HERNIA REPAIR WITH MESH (Left) INSERTION OF MESH (Left)  Patient Location: PACU  Anesthesia Type:General and Regional  Level of Consciousness: awake, alert  and oriented  Airway & Oxygen Therapy: Patient Spontanous Breathing and Patient connected to nasal cannula oxygen  Post-op Assessment: Report given to RN, Post -op Vital signs reviewed and stable and Patient moving all extremities X 4  Post vital signs: Reviewed and stable  Last Vitals:  Filed Vitals:   07/25/15 0706  BP: 133/84  Pulse: 94  Temp: 37.1 C  Resp: 18    Complications: No apparent anesthesia complications

## 2015-07-25 NOTE — Discharge Instructions (Addendum)
PATIENT INSTRUCTIONS HERNIA  FOLLOW-UP:  Please make an appointment with your physician in 2 week(s).  Call your physician immediately if you have any fevers greater than 102.5, drainage from you wound that is not clear or looks infected, persistent bleeding, increasing abdominal pain, problems urinating, or persistent nausea/vomiting.    WOUND CARE INSTRUCTIONS:  Keep a dry clean dressing on the wound if there is drainage. Keep the plastic dressing intact until seen in my clinic unless there is build up of blood or fluid underneath. If the dressing has to be removed for fluid build up, cover with 4x4 gauze until drainage stops.   Once the wound has quit draining you may leave it open to air.  If clothing rubs against the wound or causes irritation and the wound is not draining you may cover it with a dry dressing during the daytime.  Try to keep the wound dry and avoid ointments on the wound unless directed to do so.  If the wound becomes bright red and painful or starts to drain infected material that is not clear, please contact your physician immediately.  If the wound is mildly pink and has a thick firm ridge underneath it, this is normal, and is referred to as a healing ridge.  This will resolve over the next 4-6 weeks.  DIET:  You may eat any foods that you can tolerate.  It is a good idea to eat a high fiber diet and take in plenty of fluids to prevent constipation.  If you do become constipated you may want to take a mild laxative or take ducolax tablets on a daily basis until your bowel habits are regular.  Constipation can be very uncomfortable, along with straining, after recent abdominal surgery.  ACTIVITY:  You are encouraged to cough and deep breath or use your incentive spirometer if you were given one, every 15-30 minutes when awake.  This will help prevent respiratory complications and low grade fevers post-operatively.  You may want to hug a pillow when coughing and sneezing to add  additional support to the surgical area which will decrease pain during these times.  You are encouraged to walk and engage in light activity for the next two weeks.  You should not lift more than 20 pounds during this time frame as it could put you at increased risk for a hernia recurrence.  Twenty pounds is roughly equivalent to a plastic bag of groceries.    MEDICATIONS:  Try to take narcotic medications and anti-inflammatory medications, such as tylenol, ibuprofen, naprosyn, etc., with food.  This will minimize stomach upset from the medication.  Should you develop nausea and vomiting from the pain medication, or develop a rash, please discontinue the medication and contact your physician.  You should not drive, make important decisions, or operate machinery when taking narcotic pain medication.  QUESTIONS:  Please feel free to call your physician or the hospital operator if you have any questions, and they will be glad to assist you. Marta LamasJames O. Gae BonWyatt, [name], MD, FACS [phone]--pager [phone]--office [facility]  Open Hernia Repair, Care After Refer to this sheet in the next few weeks. These instructions provide you with information on caring for yourself after your procedure. Your health care provider may also give you more specific instructions. Your treatment has been planned according to current medical practices, but problems sometimes occur. Call your health care provider if you have any problems or questions after your procedure. WHAT TO EXPECT AFTER THE PROCEDURE  After your procedure, it is typical to have the following:  Pain in your abdomen, especially along your incision. You will be given pain medicines to control the pain.  Constipation. You may be given a stool softener to help prevent this. HOME CARE INSTRUCTIONS  Only take over-the-counter or prescription medicines as directed by your health care provider.  Keep the incision area dry and clean. You may  wash the incision area gently with soap and water 48 hours after surgery. Gently blot or dab the incision area dry. Do not take baths, use swimming pools, or use hot tubs for 10 days or until your health care provider approves.  Change bandages (dressings) as directed by your health care provider.  Continue your normal diet as directed by your health care provider. Eat plenty of fruits and vegetables to help prevent constipation.  Drink enough fluids to keep your urine clear or pale yellow. This also helps prevent constipation.  Do not drive until your health care provider says it is okay.  Do not lift anything heavier than 10 lb (4.5 kg) or play contact sports for 4 weeks or until your health care provider approves.  Follow up with your health care provider as directed. Ask your health care provider when to make an appointment to have your stitches (sutures) or staples removed. SEEK MEDICAL CARE IF:  You have increased bleeding coming from the incision site.  You have blood in your stool.  You have increasing pain in the incision area.  You see redness or swelling in the incision area.  You have fluid (pus) coming from the incision.  You have a fever.  You notice a bad smell coming from the incision area or dressing. SEEK IMMEDIATE MEDICAL CARE IF:  You develop a rash.  You have chest pain or shortness of breath.  You feel lightheaded or feel faint.   This information is not intended to replace advice given to you by your health care provider. Make sure you discuss any questions you have with your health care provider.   Document Released: 11/02/2004 Document Revised: 05/06/2014 Document Reviewed: 11/25/2012 Elsevier Interactive Patient Education [employer]

## 2015-07-25 NOTE — Anesthesia Postprocedure Evaluation (Signed)
Anesthesia Post Note  Patient: Allen Sims  Procedure(s) Performed: Procedure(s) (LRB): OPEN LEFT INGUINAL HERNIA REPAIR WITH MESH (Left) INSERTION OF MESH (Left)  Patient location during evaluation: PACU Anesthesia Type: General Level of consciousness: awake and alert, oriented and patient cooperative Pain management: pain level controlled Vital Signs Assessment: post-procedure vital signs reviewed and stable Respiratory status: spontaneous breathing, nonlabored ventilation and respiratory function stable Cardiovascular status: blood pressure returned to baseline and stable Postop Assessment: no signs of nausea or vomiting Anesthetic complications: no    Last Vitals:  Filed Vitals:   07/25/15 1145 07/25/15 1218  BP: 128/91   Pulse: 88 109  Temp: 36.7 C   Resp: 22     Last Pain:  Filed Vitals:   07/25/15 1241  PainSc: 8                  Jeanita Carneiro,E. Yohana Bartha

## 2015-07-25 NOTE — Op Note (Signed)
OPERATIVE REPORT  DATE OF OPERATION: 07/25/2015  PATIENT:  Marty HeckBrian T Headen  25 y.o. male  PRE-OPERATIVE DIAGNOSIS:  left inguinal hernia  POST-OPERATIVE DIAGNOSIS:  Indirect left inguinal hernia  PROCEDURE:  Procedure(s): OPEN LEFT INGUINAL HERNIA REPAIR WITH MESH INSERTION OF MESH  SURGEON:  Surgeon(s): Jimmye NormanJames Yug Loria, MD  ASSISTANT: None  ANESTHESIA:   general  EBL: <20 ml  BLOOD ADMINISTERED: none  DRAINS: none   SPECIMEN:  Source of Specimen:  Sac, lymph node, fatty contents  COUNTS CORRECT:  YES  PROCEDURE DETAILS: The patient was taken to the operating room and placed on table in supine position. After an adequate general endotracheal anesthetic was administered, and a tap block in place, he was prepped and draped in usual sterile manner exposing his left inguinal area.  A proper timeout was performed identifying the patient and procedure to be performed along with the correct side. The area of the incision was marked with marking pen approximately 6-6 cm long. #10 blade was used to make an incision down into the subcutaneous tissue. Using electrocautery hemostasis was obtained as was dissected down to the external oblique fascia.  The fascia was opened along its fibers through the superficial ring. The spermatic cord was mobilized at the pubic tubercle and encircled with a Penrose drain. The indirect sac could be seen bulging on the anterior medial aspect. A contained mostly fatty contents. We did not open the sac except to make sure there was no bowel contents. The sac at the base and transected it. The distal sac was sent as a specimen which contain fatty tissue and a lymph node.  The proximal transected sac was closed using a suture ligature of 0 Ethibond suture 2.  Once this was done an oval piece of polypropylene mesh was placed in the internal floor using running 0 Prolene suture. It was soaked in and about solution prior to being implanted. Attached to the  reflected portion of the inguinal ligament inferolaterally and the conjoined tendon anterior medially. Once this was in place we irrigated with anabiotic solution.  The ilioinguinal nerve was noticed and the canal was there throughout the case. Care was taken not to entrap with closure. Oblique fascia was closed using a running 4-0 Vicryl suture. Scarpa's fascia was reapproximated using 3-0 Vicryl suture. The skin was then closed using running subcuticular stitch of 3-0 Monocryl. The skin was ultimately closed and secured using Dermabond Steri-Strips and Tegaderm. All needle counts, sponge counts, and instrument counts were correct.  PATIENT DISPOSITION:  PACU - hemodynamically stable.   Aahil Fredin 3/28/20179:07 AM

## 2015-07-25 NOTE — Progress Notes (Signed)
Attempted to void but to no avail. Patient up on a recliner. Doze back to sleep easily.

## 2015-07-26 ENCOUNTER — Encounter (HOSPITAL_COMMUNITY): Payer: Self-pay | Admitting: General Surgery

## 2015-10-23 ENCOUNTER — Encounter (HOSPITAL_COMMUNITY): Payer: Self-pay | Admitting: *Deleted
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
## 2015-10-23 NOTE — Progress Notes (Signed)
Called pt for pre-op call and pt was not aware that he was having surgery tomorrow. He states he will come though. Pt denies cardiac history, chest pain or sob.

## 2015-10-24 ENCOUNTER — Encounter (HOSPITAL_COMMUNITY): Payer: Self-pay | Admitting: Anesthesiology

## 2015-10-24 ENCOUNTER — Inpatient Hospital Stay (HOSPITAL_COMMUNITY)
Admission: RE | Admit: 2015-10-24 | Discharge: 2015-10-28 | DRG: 516 | Disposition: A | Discharge status: Home Health | Payer: Worker's Compensation | Source: Ambulatory Visit | Attending: Orthopedic Surgery | Admitting: Orthopedic Surgery

## 2015-10-24 ENCOUNTER — Inpatient Hospital Stay (HOSPITAL_COMMUNITY): Payer: Worker's Compensation | Admitting: Anesthesiology

## 2015-10-24 ENCOUNTER — Encounter (HOSPITAL_COMMUNITY)
Admission: RE | Disposition: A | Discharge status: Home Health | Payer: Self-pay | Source: Ambulatory Visit | Attending: Orthopedic Surgery

## 2015-10-24 ENCOUNTER — Inpatient Hospital Stay (HOSPITAL_COMMUNITY): Payer: Worker's Compensation

## 2015-10-24 ENCOUNTER — Observation Stay (HOSPITAL_COMMUNITY): Payer: Worker's Compensation

## 2015-10-24 DIAGNOSIS — M21372 Foot drop, left foot: Secondary | ICD-10-CM | POA: Diagnosis present

## 2015-10-24 DIAGNOSIS — M869 Osteomyelitis, unspecified: Secondary | ICD-10-CM | POA: Diagnosis present

## 2015-10-24 DIAGNOSIS — F1911 Other psychoactive substance abuse, in remission: Secondary | ICD-10-CM | POA: Diagnosis present

## 2015-10-24 DIAGNOSIS — F1410 Cocaine abuse, uncomplicated: Secondary | ICD-10-CM | POA: Diagnosis present

## 2015-10-24 DIAGNOSIS — Z789 Other specified health status: Secondary | ICD-10-CM | POA: Diagnosis present

## 2015-10-24 DIAGNOSIS — Z881 Allergy status to other antibiotic agents status: Secondary | ICD-10-CM

## 2015-10-24 DIAGNOSIS — Y793 Surgical instruments, materials and orthopedic devices (including sutures) associated with adverse incidents: Secondary | ICD-10-CM | POA: Diagnosis present

## 2015-10-24 DIAGNOSIS — F17290 Nicotine dependence, other tobacco product, uncomplicated: Secondary | ICD-10-CM | POA: Diagnosis present

## 2015-10-24 DIAGNOSIS — K219 Gastro-esophageal reflux disease without esophagitis: Secondary | ICD-10-CM | POA: Diagnosis present

## 2015-10-24 DIAGNOSIS — F419 Anxiety disorder, unspecified: Secondary | ICD-10-CM | POA: Diagnosis present

## 2015-10-24 DIAGNOSIS — S32512K Fracture of superior rim of left pubis, subsequent encounter for fracture with nonunion: Principal | ICD-10-CM

## 2015-10-24 DIAGNOSIS — M899 Disorder of bone, unspecified: Secondary | ICD-10-CM | POA: Diagnosis present

## 2015-10-24 DIAGNOSIS — G8929 Other chronic pain: Secondary | ICD-10-CM | POA: Diagnosis present

## 2015-10-24 DIAGNOSIS — G629 Polyneuropathy, unspecified: Secondary | ICD-10-CM | POA: Diagnosis present

## 2015-10-24 DIAGNOSIS — D62 Acute posthemorrhagic anemia: Secondary | ICD-10-CM | POA: Diagnosis not present

## 2015-10-24 DIAGNOSIS — L03818 Cellulitis of other sites: Secondary | ICD-10-CM | POA: Diagnosis not present

## 2015-10-24 DIAGNOSIS — Z419 Encounter for procedure for purposes other than remedying health state, unspecified: Secondary | ICD-10-CM

## 2015-10-24 DIAGNOSIS — S329XXK Fracture of unspecified parts of lumbosacral spine and pelvis, subsequent encounter for fracture with nonunion: Secondary | ICD-10-CM | POA: Diagnosis present

## 2015-10-24 DIAGNOSIS — G5732 Lesion of lateral popliteal nerve, left lower limb: Secondary | ICD-10-CM | POA: Diagnosis present

## 2015-10-24 DIAGNOSIS — Y838 Other surgical procedures as the cause of abnormal reaction of the patient, or of later complication, without mention of misadventure at the time of the procedure: Secondary | ICD-10-CM | POA: Diagnosis present

## 2015-10-24 DIAGNOSIS — M545 Low back pain: Secondary | ICD-10-CM | POA: Diagnosis present

## 2015-10-24 DIAGNOSIS — T8484XA Pain due to internal orthopedic prosthetic devices, implants and grafts, initial encounter: Secondary | ICD-10-CM | POA: Diagnosis present

## 2015-10-24 DIAGNOSIS — F4310 Post-traumatic stress disorder, unspecified: Secondary | ICD-10-CM | POA: Diagnosis present

## 2015-10-24 HISTORY — DX: Other specified postprocedural states: R11.2

## 2015-10-24 HISTORY — DX: Osteomyelitis, unspecified: M86.9

## 2015-10-24 HISTORY — DX: Other specified health status: Z78.9

## 2015-10-24 HISTORY — DX: Other complications of anesthesia, initial encounter: T88.59XA

## 2015-10-24 HISTORY — DX: Gastro-esophageal reflux disease without esophagitis: K21.9

## 2015-10-24 HISTORY — PX: ORIF PELVIC FRACTURE: SHX2128

## 2015-10-24 HISTORY — DX: Adverse effect of unspecified anesthetic, initial encounter: T41.45XA

## 2015-10-24 HISTORY — DX: Other chronic pain: G89.29

## 2015-10-24 HISTORY — PX: SACRO-ILIAC PINNING: SHX5050

## 2015-10-24 HISTORY — DX: Lesion of lateral popliteal nerve, left lower limb: G57.32

## 2015-10-24 HISTORY — DX: Nausea with vomiting, unspecified: Z98.890

## 2015-10-24 HISTORY — DX: Restless legs syndrome: G25.81

## 2015-10-24 HISTORY — DX: Other psychoactive substance abuse, in remission: F19.11

## 2015-10-24 LAB — CBC WITH DIFFERENTIAL/PLATELET
Basophils Absolute: 0.1 10*3/uL (ref 0.0–0.1)
Basophils Relative: 1 %
EOS ABS: 0.4 10*3/uL (ref 0.0–0.7)
EOS PCT: 2 %
HCT: 48.6 % (ref 39.0–52.0)
Hemoglobin: 17.5 g/dL — ABNORMAL HIGH (ref 13.0–17.0)
LYMPHS ABS: 3.5 10*3/uL (ref 0.7–4.0)
LYMPHS PCT: 23 %
MCH: 32 pg (ref 26.0–34.0)
MCHC: 36 g/dL (ref 30.0–36.0)
MCV: 88.8 fL (ref 78.0–100.0)
MONO ABS: 1.1 10*3/uL — AB (ref 0.1–1.0)
MONOS PCT: 7 %
Neutro Abs: 10.5 10*3/uL — ABNORMAL HIGH (ref 1.7–7.7)
Neutrophils Relative %: 67 %
PLATELETS: 228 10*3/uL (ref 150–400)
RBC: 5.47 MIL/uL (ref 4.22–5.81)
RDW: 12.1 % (ref 11.5–15.5)
WBC: 15.5 10*3/uL — ABNORMAL HIGH (ref 4.0–10.5)

## 2015-10-24 LAB — PROTIME-INR
INR: 1.01 (ref 0.00–1.49)
PROTHROMBIN TIME: 13.5 s (ref 11.6–15.2)

## 2015-10-24 LAB — RAPID URINE DRUG SCREEN, HOSP PERFORMED
Amphetamines: NOT DETECTED
BENZODIAZEPINES: NOT DETECTED
Barbiturates: NOT DETECTED
COCAINE: POSITIVE — AB
Opiates: NOT DETECTED
Tetrahydrocannabinol: NOT DETECTED

## 2015-10-24 LAB — COMPREHENSIVE METABOLIC PANEL
ALT: 32 U/L (ref 17–63)
ANION GAP: 10 (ref 5–15)
AST: 23 U/L (ref 15–41)
Albumin: 4.4 g/dL (ref 3.5–5.0)
Alkaline Phosphatase: 66 U/L (ref 38–126)
BUN: 10 mg/dL (ref 6–20)
CHLORIDE: 104 mmol/L (ref 101–111)
CO2: 23 mmol/L (ref 22–32)
CREATININE: 1.24 mg/dL (ref 0.61–1.24)
Calcium: 9.8 mg/dL (ref 8.9–10.3)
Glucose, Bld: 96 mg/dL (ref 65–99)
POTASSIUM: 3.8 mmol/L (ref 3.5–5.1)
SODIUM: 137 mmol/L (ref 135–145)
Total Bilirubin: 0.8 mg/dL (ref 0.3–1.2)
Total Protein: 7.6 g/dL (ref 6.5–8.1)

## 2015-10-24 LAB — C-REACTIVE PROTEIN: CRP: 0.9 mg/dL (ref <1.0)

## 2015-10-24 LAB — URINALYSIS, ROUTINE W REFLEX MICROSCOPIC
BILIRUBIN URINE: NEGATIVE
Glucose, UA: NEGATIVE mg/dL
Hgb urine dipstick: NEGATIVE
KETONES UR: NEGATIVE mg/dL
Leukocytes, UA: NEGATIVE
NITRITE: NEGATIVE
PH: 6 (ref 5.0–8.0)
PROTEIN: NEGATIVE mg/dL
Specific Gravity, Urine: 1.009 (ref 1.005–1.030)

## 2015-10-24 LAB — ABO/RH: ABO/RH(D): A POS

## 2015-10-24 LAB — TYPE AND SCREEN
ABO/RH(D): A POS
ANTIBODY SCREEN: NEGATIVE

## 2015-10-24 LAB — APTT: APTT: 22 s — AB (ref 24–37)

## 2015-10-24 SURGERY — PINNING, SACROILIAC JOINT, PERCUTANEOUS
Anesthesia: General | Laterality: Left

## 2015-10-24 MED ORDER — HYDROMORPHONE HCL 1 MG/ML IJ SOLN
INTRAMUSCULAR | Status: AC
  Filled 2015-10-24: qty 1

## 2015-10-24 MED ORDER — SUGAMMADEX SODIUM 200 MG/2ML IV SOLN
INTRAVENOUS | Status: DC | PRN
  Administered 2015-10-24: 177 mg via INTRAVENOUS

## 2015-10-24 MED ORDER — DEXTROSE 5 % IV SOLN
10.0000 mg | INTRAVENOUS | Status: DC | PRN
  Administered 2015-10-24: 20 ug/min via INTRAVENOUS

## 2015-10-24 MED ORDER — POTASSIUM CHLORIDE IN NACL 20-0.9 MEQ/L-% IV SOLN
INTRAVENOUS | Status: DC
  Administered 2015-10-24 – 2015-10-26 (×3): via INTRAVENOUS
  Filled 2015-10-24 (×3): qty 1000

## 2015-10-24 MED ORDER — METOCLOPRAMIDE HCL 5 MG/ML IJ SOLN: 5.0000 mg | Freq: Three times a day (TID) | INTRAMUSCULAR | Status: DC | PRN

## 2015-10-24 MED ORDER — SCOPOLAMINE 1 MG/3DAYS TD PT72
1.0000 | MEDICATED_PATCH | TRANSDERMAL | Status: DC
  Administered 2015-10-24: 1.5 mg via TRANSDERMAL
  Filled 2015-10-24: qty 1

## 2015-10-24 MED ORDER — HYDROCODONE-ACETAMINOPHEN 7.5-325 MG PO TABS
ORAL_TABLET | ORAL | Status: AC
  Filled 2015-10-24: qty 2

## 2015-10-24 MED ORDER — METHOCARBAMOL 1000 MG/10ML IJ SOLN
1000.0000 mg | Freq: Four times a day (QID) | INTRAMUSCULAR | Status: DC
  Filled 2015-10-24 (×16): qty 10

## 2015-10-24 MED ORDER — SUGAMMADEX SODIUM 200 MG/2ML IV SOLN
INTRAVENOUS | Status: AC
  Filled 2015-10-24: qty 2

## 2015-10-24 MED ORDER — FENTANYL CITRATE (PF) 250 MCG/5ML IJ SOLN
INTRAMUSCULAR | Status: AC
  Filled 2015-10-24: qty 5

## 2015-10-24 MED ORDER — ROCURONIUM BROMIDE 50 MG/5ML IV SOLN
INTRAVENOUS | Status: AC
  Filled 2015-10-24: qty 2

## 2015-10-24 MED ORDER — CLINDAMYCIN PHOSPHATE 600 MG/50ML IV SOLN
600.0000 mg | Freq: Four times a day (QID) | INTRAVENOUS | Status: AC
  Administered 2015-10-24 – 2015-10-25 (×3): 600 mg via INTRAVENOUS
  Filled 2015-10-24 (×3): qty 50

## 2015-10-24 MED ORDER — HYDROMORPHONE HCL 1 MG/ML IJ SOLN
0.2500 mg | INTRAMUSCULAR | Status: DC | PRN
  Administered 2015-10-24 (×4): 0.5 mg via INTRAVENOUS

## 2015-10-24 MED ORDER — ENOXAPARIN SODIUM 40 MG/0.4ML ~~LOC~~ SOLN
40.0000 mg | SUBCUTANEOUS | Status: DC
  Administered 2015-10-25 – 2015-10-28 (×4): 40 mg via SUBCUTANEOUS
  Filled 2015-10-24 (×4): qty 0.4

## 2015-10-24 MED ORDER — ACETAMINOPHEN 325 MG PO TABS: 650.0000 mg | ORAL_TABLET | Freq: Four times a day (QID) | ORAL | Status: DC | PRN

## 2015-10-24 MED ORDER — PHENYLEPHRINE HCL 10 MG/ML IJ SOLN
INTRAMUSCULAR | Status: DC | PRN
  Administered 2015-10-24 (×5): 40 ug via INTRAVENOUS
  Administered 2015-10-24 (×2): 80 ug via INTRAVENOUS
  Administered 2015-10-24: 40 ug via INTRAVENOUS

## 2015-10-24 MED ORDER — ONDANSETRON HCL 4 MG/2ML IJ SOLN
INTRAMUSCULAR | Status: AC
  Filled 2015-10-24: qty 2

## 2015-10-24 MED ORDER — MEPERIDINE HCL 25 MG/ML IJ SOLN: 6.2500 mg | INTRAMUSCULAR | Status: DC | PRN

## 2015-10-24 MED ORDER — HYDROCODONE-ACETAMINOPHEN 7.5-325 MG PO TABS
1.0000 | ORAL_TABLET | Freq: Four times a day (QID) | ORAL | Status: DC | PRN
  Administered 2015-10-24 – 2015-10-27 (×8): 2 via ORAL
  Filled 2015-10-24 (×7): qty 2

## 2015-10-24 MED ORDER — CHLORHEXIDINE GLUCONATE 4 % EX LIQD: 60.0000 mL | Freq: Once | CUTANEOUS | Status: DC

## 2015-10-24 MED ORDER — ONDANSETRON HCL 4 MG/2ML IJ SOLN: 4.0000 mg | Freq: Four times a day (QID) | INTRAMUSCULAR | Status: DC | PRN

## 2015-10-24 MED ORDER — LACTATED RINGERS IV SOLN
INTRAVENOUS | Status: DC
  Administered 2015-10-24 (×3): via INTRAVENOUS

## 2015-10-24 MED ORDER — LIDOCAINE 2% (20 MG/ML) 5 ML SYRINGE
INTRAMUSCULAR | Status: AC
  Filled 2015-10-24: qty 5

## 2015-10-24 MED ORDER — ACETAMINOPHEN 500 MG PO TABS
1000.0000 mg | ORAL_TABLET | Freq: Once | ORAL | Status: AC
  Administered 2015-10-24: 1000 mg via ORAL
  Filled 2015-10-24: qty 2

## 2015-10-24 MED ORDER — METOCLOPRAMIDE HCL 5 MG/ML IJ SOLN: 10.0000 mg | Freq: Once | INTRAMUSCULAR | Status: DC | PRN

## 2015-10-24 MED ORDER — PREGABALIN 75 MG PO CAPS
150.0000 mg | ORAL_CAPSULE | Freq: Two times a day (BID) | ORAL | Status: DC
  Administered 2015-10-24 – 2015-10-28 (×8): 150 mg via ORAL
  Filled 2015-10-24 (×8): qty 2

## 2015-10-24 MED ORDER — ACETAMINOPHEN 650 MG RE SUPP: 650.0000 mg | Freq: Four times a day (QID) | RECTAL | Status: DC | PRN

## 2015-10-24 MED ORDER — FENTANYL CITRATE (PF) 100 MCG/2ML IJ SOLN
INTRAMUSCULAR | Status: DC | PRN
  Administered 2015-10-24: 25 ug via INTRAVENOUS
  Administered 2015-10-24: 50 ug via INTRAVENOUS
  Administered 2015-10-24: 25 ug via INTRAVENOUS
  Administered 2015-10-24 (×5): 50 ug via INTRAVENOUS

## 2015-10-24 MED ORDER — MIDAZOLAM HCL 2 MG/2ML IJ SOLN
INTRAMUSCULAR | Status: AC
  Filled 2015-10-24: qty 2

## 2015-10-24 MED ORDER — 0.9 % SODIUM CHLORIDE (POUR BTL) OPTIME
TOPICAL | Status: DC | PRN
  Administered 2015-10-24: 1000 mL

## 2015-10-24 MED ORDER — HYDROMORPHONE HCL 1 MG/ML IJ SOLN
0.5000 mg | INTRAMUSCULAR | Status: DC | PRN
Start: 2015-10-24 — End: 2015-10-28
  Administered 2015-10-24 – 2015-10-28 (×18): 1 mg via INTRAVENOUS
  Filled 2015-10-24 (×18): qty 1

## 2015-10-24 MED ORDER — ONDANSETRON HCL 4 MG PO TABS: 4.0000 mg | ORAL_TABLET | Freq: Four times a day (QID) | ORAL | Status: DC | PRN

## 2015-10-24 MED ORDER — METHOCARBAMOL 500 MG PO TABS
1000.0000 mg | ORAL_TABLET | Freq: Four times a day (QID) | ORAL | Status: DC
  Administered 2015-10-24 – 2015-10-28 (×16): 1000 mg via ORAL
  Filled 2015-10-24 (×16): qty 2

## 2015-10-24 MED ORDER — PROPOFOL 10 MG/ML IV BOLUS
INTRAVENOUS | Status: DC | PRN
  Administered 2015-10-24: 200 mg via INTRAVENOUS
  Administered 2015-10-24: 20 mg via INTRAVENOUS

## 2015-10-24 MED ORDER — LIDOCAINE HCL (CARDIAC) 20 MG/ML IV SOLN
INTRAVENOUS | Status: DC | PRN
  Administered 2015-10-24: 100 mg via INTRAVENOUS

## 2015-10-24 MED ORDER — METOCLOPRAMIDE HCL 5 MG PO TABS: 5.0000 mg | ORAL_TABLET | Freq: Three times a day (TID) | ORAL | Status: DC | PRN

## 2015-10-24 MED ORDER — PROPOFOL 10 MG/ML IV BOLUS
INTRAVENOUS | Status: AC
  Filled 2015-10-24: qty 20

## 2015-10-24 MED ORDER — DIPHENHYDRAMINE HCL 50 MG/ML IJ SOLN
12.5000 mg | Freq: Four times a day (QID) | INTRAMUSCULAR | Status: DC | PRN
  Administered 2015-10-24: 12.5 mg via INTRAVENOUS
  Filled 2015-10-24: qty 1

## 2015-10-24 MED ORDER — TRAMADOL HCL 50 MG PO TABS
50.0000 mg | ORAL_TABLET | Freq: Four times a day (QID) | ORAL | Status: DC | PRN
  Administered 2015-10-25 – 2015-10-27 (×8): 100 mg via ORAL
  Filled 2015-10-24 (×8): qty 2

## 2015-10-24 MED ORDER — MIDAZOLAM HCL 5 MG/5ML IJ SOLN
INTRAMUSCULAR | Status: DC | PRN
  Administered 2015-10-24 (×2): 1 mg via INTRAVENOUS

## 2015-10-24 MED ORDER — ONDANSETRON HCL 4 MG/2ML IJ SOLN
INTRAMUSCULAR | Status: DC | PRN
  Administered 2015-10-24: 4 mg via INTRAVENOUS

## 2015-10-24 MED ORDER — PHENYLEPHRINE 40 MCG/ML (10ML) SYRINGE FOR IV PUSH (FOR BLOOD PRESSURE SUPPORT)
PREFILLED_SYRINGE | INTRAVENOUS | Status: AC
  Filled 2015-10-24: qty 10

## 2015-10-24 MED ORDER — BUPIVACAINE-EPINEPHRINE 0.25% -1:200000 IJ SOLN
INTRAMUSCULAR | Status: DC | PRN
  Administered 2015-10-24: 10 mL

## 2015-10-24 MED ORDER — CLINDAMYCIN PHOSPHATE 900 MG/50ML IV SOLN
900.0000 mg | INTRAVENOUS | Status: AC
  Administered 2015-10-24: 900 mg via INTRAVENOUS
  Filled 2015-10-24: qty 50

## 2015-10-24 MED ORDER — ROCURONIUM BROMIDE 100 MG/10ML IV SOLN
INTRAVENOUS | Status: DC | PRN
  Administered 2015-10-24: 20 mg via INTRAVENOUS
  Administered 2015-10-24: 10 mg via INTRAVENOUS
  Administered 2015-10-24: 50 mg via INTRAVENOUS
  Administered 2015-10-24 (×2): 10 mg via INTRAVENOUS

## 2015-10-24 MED ORDER — BUPIVACAINE-EPINEPHRINE (PF) 0.25% -1:200000 IJ SOLN
INTRAMUSCULAR | Status: AC
  Filled 2015-10-24: qty 30

## 2015-10-24 MED ORDER — DOCUSATE SODIUM 100 MG PO CAPS
100.0000 mg | ORAL_CAPSULE | Freq: Two times a day (BID) | ORAL | Status: DC
  Administered 2015-10-24 – 2015-10-28 (×8): 100 mg via ORAL
  Filled 2015-10-24 (×8): qty 1

## 2015-10-24 SURGICAL SUPPLY — 79 items
APL SKNCLS STERI-STRIP NONHPOA (GAUZE/BANDAGES/DRESSINGS) ×2
BENZOIN TINCTURE PRP APPL 2/3 (GAUZE/BANDAGES/DRESSINGS) ×8 IMPLANT
BIT DRILL AO MATTA 2.5MX230M (BIT) ×2 IMPLANT
BLADE SURG 15 STRL LF DISP TIS (BLADE) IMPLANT
BLADE SURG 15 STRL SS (BLADE)
BLADE SURG ROTATE 9660 (MISCELLANEOUS) IMPLANT
BRUSH SCRUB DISP (MISCELLANEOUS) ×8 IMPLANT
CLOSURE STERI-STRIP 1/2X4 (GAUZE/BANDAGES/DRESSINGS) ×1
CLOSURE WOUND 1/2 X4 (GAUZE/BANDAGES/DRESSINGS) ×1
CLSR STERI-STRIP ANTIMIC 1/2X4 (GAUZE/BANDAGES/DRESSINGS) ×3 IMPLANT
COVER SURGICAL LIGHT HANDLE (MISCELLANEOUS) ×4 IMPLANT
DRAIN CHANNEL 15F RND FF W/TCR (WOUND CARE) IMPLANT
DRAIN PENROSE 1/2X12 LTX STRL (WOUND CARE) ×4 IMPLANT
DRAPE C-ARM 42X72 X-RAY (DRAPES) ×8 IMPLANT
DRAPE C-ARMOR (DRAPES) ×4 IMPLANT
DRAPE IMP U-DRAPE 54X76 (DRAPES) ×4 IMPLANT
DRAPE INCISE IOBAN 66X45 STRL (DRAPES) ×4 IMPLANT
DRAPE LAPAROTOMY TRNSV 102X78 (DRAPE) ×4 IMPLANT
DRAPE U-SHAPE 47X51 STRL (DRAPES) ×8 IMPLANT
DRILL BIT AO MATTA 2.5MX230M (BIT) ×4
DRSG ADAPTIC 3X8 NADH LF (GAUZE/BANDAGES/DRESSINGS) ×4 IMPLANT
DRSG MEPILEX BORDER 4X4 (GAUZE/BANDAGES/DRESSINGS) ×4 IMPLANT
DRSG MEPITEL 4X7.2 (GAUZE/BANDAGES/DRESSINGS) ×8 IMPLANT
DRSG PAD ABDOMINAL 8X10 ST (GAUZE/BANDAGES/DRESSINGS) ×8 IMPLANT
ELECT BLADE 4.0 EZ CLEAN MEGAD (MISCELLANEOUS) ×4
ELECT REM PT RETURN 9FT ADLT (ELECTROSURGICAL) ×4
ELECTRODE BLDE 4.0 EZ CLN MEGD (MISCELLANEOUS) ×2 IMPLANT
ELECTRODE REM PT RTRN 9FT ADLT (ELECTROSURGICAL) ×2 IMPLANT
EVACUATOR SILICONE 100CC (DRAIN) IMPLANT
GAUZE SPONGE 4X4 12PLY STRL (GAUZE/BANDAGES/DRESSINGS) ×8 IMPLANT
GAUZE XEROFORM 5X9 LF (GAUZE/BANDAGES/DRESSINGS) ×4 IMPLANT
GLOVE BIO SURGEON STRL SZ7.5 (GLOVE) ×8 IMPLANT
GLOVE BIO SURGEON STRL SZ8 (GLOVE) ×4 IMPLANT
GLOVE BIOGEL PI IND STRL 6.5 (GLOVE) ×2 IMPLANT
GLOVE BIOGEL PI IND STRL 7.5 (GLOVE) ×2 IMPLANT
GLOVE BIOGEL PI IND STRL 8 (GLOVE) ×4 IMPLANT
GLOVE BIOGEL PI INDICATOR 6.5 (GLOVE) ×2
GLOVE BIOGEL PI INDICATOR 7.5 (GLOVE) ×2
GLOVE BIOGEL PI INDICATOR 8 (GLOVE) ×4
GLOVE SURG SS PI 6.5 STRL IVOR (GLOVE) ×4 IMPLANT
GOWN STRL REUS W/ TWL LRG LVL3 (GOWN DISPOSABLE) ×10 IMPLANT
GOWN STRL REUS W/ TWL XL LVL3 (GOWN DISPOSABLE) ×2 IMPLANT
GOWN STRL REUS W/TWL LRG LVL3 (GOWN DISPOSABLE) ×10
GOWN STRL REUS W/TWL XL LVL3 (GOWN DISPOSABLE) ×2
GUIDEWIRE THRD ASNIS 3.2X300 (WIRE) ×4 IMPLANT
KIT BASIN OR (CUSTOM PROCEDURE TRAY) ×4 IMPLANT
KIT ROOM TURNOVER OR (KITS) ×4 IMPLANT
MANIFOLD NEPTUNE II (INSTRUMENTS) IMPLANT
NEEDLE 22X1 1/2 (OR ONLY) (NEEDLE) ×4 IMPLANT
NS IRRIG 1000ML POUR BTL (IV SOLUTION) ×4 IMPLANT
PACK FOAM VITOSS BBTRAUMA 2.5 (Bone Implant) ×4 IMPLANT
PACK GENERAL/GYN (CUSTOM PROCEDURE TRAY) ×4 IMPLANT
PACK TOTAL JOINT (CUSTOM PROCEDURE TRAY) IMPLANT
PACK UNIVERSAL I (CUSTOM PROCEDURE TRAY) IMPLANT
PAD ARMBOARD 7.5X6 YLW CONV (MISCELLANEOUS) ×8 IMPLANT
SPONGE LAP 18X18 X RAY DECT (DISPOSABLE) ×4 IMPLANT
SPONGE SURGIFOAM ABS GEL 100 (HEMOSTASIS) ×4 IMPLANT
STAPLER VISISTAT 35W (STAPLE) ×4 IMPLANT
STRIP CLOSURE SKIN 1/2X4 (GAUZE/BANDAGES/DRESSINGS) ×3 IMPLANT
SUCTION FRAZIER HANDLE 10FR (MISCELLANEOUS) ×2
SUCTION TUBE FRAZIER 10FR DISP (MISCELLANEOUS) ×2 IMPLANT
SUT ETHILON 3 0 PS 1 (SUTURE) ×12 IMPLANT
SUT MNCRL AB 3-0 PS2 18 (SUTURE) ×4 IMPLANT
SUT VIC AB 0 CT1 27 (SUTURE) ×6
SUT VIC AB 0 CT1 27XBRD ANBCTR (SUTURE) ×4 IMPLANT
SUT VIC AB 1 CT1 18XCR BRD 8 (SUTURE) ×4 IMPLANT
SUT VIC AB 1 CT1 27 (SUTURE) ×3
SUT VIC AB 1 CT1 27XBRD ANBCTR (SUTURE) ×2 IMPLANT
SUT VIC AB 1 CT1 8-18 (SUTURE) ×6
SUT VIC AB 2-0 CT1 27 (SUTURE) ×4
SUT VIC AB 2-0 CT1 TAPERPNT 27 (SUTURE) ×4 IMPLANT
SUT VIC AB 2-0 FS1 27 (SUTURE) ×4 IMPLANT
SYR 5ML LUER SLIP (SYRINGE) ×4 IMPLANT
SYR CONTROL 10ML LL (SYRINGE) ×4 IMPLANT
TOWEL OR 17X24 6PK STRL BLUE (TOWEL DISPOSABLE) ×4 IMPLANT
TOWEL OR 17X26 10 PK STRL BLUE (TOWEL DISPOSABLE) ×4 IMPLANT
TRAY FOLEY CATH 16FRSI W/METER (SET/KITS/TRAYS/PACK) IMPLANT
UNDERPAD 30X30 INCONTINENT (UNDERPADS AND DIAPERS) IMPLANT
WATER STERILE IRR 1000ML POUR (IV SOLUTION) IMPLANT

## 2015-10-24 NOTE — Anesthesia Postprocedure Evaluation (Signed)
Anesthesia Post Note  Patient: Marty HeckBrian T Blaszczyk  Procedure(s) Performed: Procedure(s) (LRB): BILATERAL SACRO-ILIAC SCREWS (Bilateral) REPAIR OF LEFT PELVIC NON UNION ILIAC CREST BONE GRAFTING (Left)  Patient location during evaluation: PACU Anesthesia Type: General Level of consciousness: awake and alert and oriented Pain management: pain level controlled Vital Signs Assessment: post-procedure vital signs reviewed and stable Respiratory status: spontaneous breathing, nonlabored ventilation, respiratory function stable and patient connected to nasal cannula oxygen Cardiovascular status: blood pressure returned to baseline and stable Postop Assessment: no signs of nausea or vomiting Anesthetic complications: no    Last Vitals:  Filed Vitals:   10/24/15 1705 10/24/15 1715  BP: 135/64   Pulse: 64 64  Temp:    Resp: 11 11    Last Pain:  Filed Vitals:   10/24/15 1719  PainSc: 9                  Elverta Dimiceli A.

## 2015-10-24 NOTE — Brief Op Note (Signed)
10/24/2015  4:00 PM  PATIENT:  Allen Sims  25 y.o. male  PRE-OPERATIVE DIAGNOSIS:   1. LEFT PELVIC NON UNION 2. SYMPTOMATIC HARDWARE  POST-OPERATIVE DIAGNOSIS:   1. LEFT PELVIC NON UNION 2. SYMPTOMATIC HARDWARE  PROCEDURE:  Procedure(s): 1. BILATERAL SACRO-ILIAC SCREWS (Bilateral) removal 2. REPAIR OF LEFT PELVIC NONUNION ILIAC CREST BONE AUTOGRAFTING (Left)  SURGEON:  Surgeon(s) and Role:    * Myrene GalasMichael Savanah Bayles, MD - Primary  PHYSICIAN ASSISTANT: [name], PA-C  ANESTHESIA:   general  I/O:  Total I/O In: 2000 [I.V.:2000] Out: 185 [Urine:120; Blood:65]  SPECIMEN:  ANAEROBIC AND AEROBIC SWAB AND TISSUE  TOURNIQUET:  * No tourniquets in log *  DICTATION: .Other Dictation: Dictation Number [id number]

## 2015-10-24 NOTE — Progress Notes (Signed)
Orthopedic Tech Progress Note Patient Details:  Allen Sims 12/20/1990 [id number] Applied OHF with trapeze to pt.'s bed. Patient ID: Allen HeckBrian T Sims, male   DOB: 06/28/1990, 25 y.o.   MRN: [id number]   Lesle [name], [name] 10/24/2015, 8:43 PM

## 2015-10-24 NOTE — Anesthesia Procedure Notes (Signed)
Procedure Name: Intubation Date/Time: 10/24/2015 12:43 PM Performed by: Darcey NoraJAMES, Teddrick Mallari B Pre-anesthesia Checklist: Patient identified, Emergency Drugs available, Suction available and Patient being monitored Patient Re-evaluated:Patient Re-evaluated prior to inductionOxygen Delivery Method: Circle system utilized Preoxygenation: Pre-oxygenation with 100% oxygen Intubation Type: IV induction Ventilation: Mask ventilation without difficulty Laryngoscope Size: Miller and 2 Customer service manager(Zack Beckner, CRNA) Grade View: Grade II Tube type: Oral Tube size: 7.5 mm Number of attempts: 1 Airway Equipment and Method: Stylet Placement Confirmation: ETT inserted through vocal cords under direct vision,  positive ETCO2 and breath sounds checked- equal and bilateral Secured at: 22 (cm at teeth) cm Tube secured with: Tape Dental Injury: Teeth and Oropharynx as per pre-operative assessment

## 2015-10-24 NOTE — Transfer of Care (Signed)
Immediate Anesthesia Transfer of Care Note  Patient: Allen Sims  Procedure(s) Performed: Procedure(s): BILATERAL SACRO-ILIAC SCREWS (Bilateral) REPAIR OF LEFT PELVIC NON UNION ILIAC CREST BONE GRAFTING (Left)  Patient Location: PACU  Anesthesia Type:General  Level of Consciousness: awake, alert , oriented and patient cooperative  Airway & Oxygen Therapy: Patient Spontanous Breathing and Patient connected to nasal cannula oxygen  Post-op Assessment: Report given to RN, Post -op Vital signs reviewed and stable and Patient moving all extremities  Post vital signs: Reviewed and stable  Last Vitals:  Filed Vitals:   10/24/15 1116 10/24/15 1620  BP: 135/87   Pulse: 75   Temp: 37.2 C 36.7 C  Resp: 18     Last Pain:  Filed Vitals:   10/24/15 1623  PainSc: 10-Worst pain ever         Complications: No apparent anesthesia complications

## 2015-10-24 NOTE — Anesthesia Preprocedure Evaluation (Addendum)
Anesthesia Evaluation  Patient identified by MRN, date of birth, ID band Patient awake    Reviewed: Allergy & Precautions, NPO status , Patient's Chart, lab work & pertinent test results  History of Anesthesia Complications (+) PROLONGED EMERGENCE and history of anesthetic complications  Airway Mallampati: [name]  TM Distance: >3 FB Neck ROM: Full    Dental no notable dental hx. (+) Teeth Intact, Dental Advisory Given   Pulmonary asthma ,    Pulmonary exam normal breath sounds clear to auscultation       Cardiovascular negative cardio ROS Normal cardiovascular exam Rhythm:Regular Rate:Normal     Neuro/Psych PSYCHIATRIC DISORDERS Anxiety PTSDNeuropathy    GI/Hepatic Neg liver ROS, GERD  Medicated and Controlled,(+)     substance abuse  ,   Endo/Other  negative endocrine ROS  Renal/GU negative Renal ROS Bladder dysfunction  Stress incontinence at times    Musculoskeletal Hx/o pelvic Fx Chronic LBP and left leg pain   Abdominal (+) - obese,   Peds  Hematology negative hematology ROS (+)   Anesthesia Other Findings Full Beard  Reproductive/Obstetrics                           Anesthesia Physical Anesthesia Plan  ASA: II  Anesthesia Plan: General   Post-op Pain Management:    Induction: Intravenous  Airway Management Planned: Oral ETT  Additional Equipment:   Intra-op Plan:   Post-operative Plan: Extubation in OR  Informed Consent: I have reviewed the patients History and Physical, chart, labs and discussed the procedure including the risks, benefits and alternatives for the proposed anesthesia with the patient or authorized representative who has indicated his/her understanding and acceptance.   Dental advisory given  Plan Discussed with: CRNA, Anesthesiologist and Surgeon  Anesthesia Plan Comments:         Anesthesia Quick Evaluation

## 2015-10-25 DIAGNOSIS — F17290 Nicotine dependence, other tobacco product, uncomplicated: Secondary | ICD-10-CM | POA: Diagnosis present

## 2015-10-25 DIAGNOSIS — S3282XK Multiple fractures of pelvis without disruption of pelvic ring, subsequent encounter for fracture with nonunion: Secondary | ICD-10-CM | POA: Diagnosis present

## 2015-10-25 DIAGNOSIS — Y838 Other surgical procedures as the cause of abnormal reaction of the patient, or of later complication, without mention of misadventure at the time of the procedure: Secondary | ICD-10-CM | POA: Diagnosis present

## 2015-10-25 DIAGNOSIS — M21372 Foot drop, left foot: Secondary | ICD-10-CM | POA: Diagnosis present

## 2015-10-25 DIAGNOSIS — S32512K Fracture of superior rim of left pubis, subsequent encounter for fracture with nonunion: Secondary | ICD-10-CM | POA: Diagnosis not present

## 2015-10-25 DIAGNOSIS — L03818 Cellulitis of other sites: Secondary | ICD-10-CM | POA: Diagnosis not present

## 2015-10-25 DIAGNOSIS — G5732 Lesion of lateral popliteal nerve, left lower limb: Secondary | ICD-10-CM | POA: Diagnosis present

## 2015-10-25 DIAGNOSIS — G8929 Other chronic pain: Secondary | ICD-10-CM | POA: Diagnosis present

## 2015-10-25 DIAGNOSIS — D62 Acute posthemorrhagic anemia: Secondary | ICD-10-CM | POA: Diagnosis not present

## 2015-10-25 DIAGNOSIS — K219 Gastro-esophageal reflux disease without esophagitis: Secondary | ICD-10-CM | POA: Diagnosis present

## 2015-10-25 DIAGNOSIS — Y793 Surgical instruments, materials and orthopedic devices (including sutures) associated with adverse incidents: Secondary | ICD-10-CM | POA: Diagnosis present

## 2015-10-25 DIAGNOSIS — F1410 Cocaine abuse, uncomplicated: Secondary | ICD-10-CM | POA: Diagnosis present

## 2015-10-25 DIAGNOSIS — M869 Osteomyelitis, unspecified: Secondary | ICD-10-CM | POA: Diagnosis present

## 2015-10-25 DIAGNOSIS — M545 Low back pain: Secondary | ICD-10-CM | POA: Diagnosis present

## 2015-10-25 DIAGNOSIS — M899 Disorder of bone, unspecified: Secondary | ICD-10-CM | POA: Diagnosis present

## 2015-10-25 DIAGNOSIS — F4310 Post-traumatic stress disorder, unspecified: Secondary | ICD-10-CM | POA: Diagnosis present

## 2015-10-25 DIAGNOSIS — Z881 Allergy status to other antibiotic agents status: Secondary | ICD-10-CM | POA: Diagnosis not present

## 2015-10-25 DIAGNOSIS — G629 Polyneuropathy, unspecified: Secondary | ICD-10-CM | POA: Diagnosis present

## 2015-10-25 LAB — CALCITRIOL (1,25 DI-OH VIT D): Vit D, 1,25-Dihydroxy: 45.6 pg/mL (ref 19.9–79.3)

## 2015-10-25 LAB — BASIC METABOLIC PANEL
ANION GAP: 4 — AB (ref 5–15)
BUN: 7 mg/dL (ref 6–20)
CALCIUM: 8.7 mg/dL — AB (ref 8.9–10.3)
CO2: 32 mmol/L (ref 22–32)
Chloride: 103 mmol/L (ref 101–111)
Creatinine, Ser: 1.25 mg/dL — ABNORMAL HIGH (ref 0.61–1.24)
Glucose, Bld: 103 mg/dL — ABNORMAL HIGH (ref 65–99)
Potassium: 4.1 mmol/L (ref 3.5–5.1)
SODIUM: 139 mmol/L (ref 135–145)

## 2015-10-25 LAB — VITAMIN D 25 HYDROXY (VIT D DEFICIENCY, FRACTURES): Vit D, 25-Hydroxy: 33.6 ng/mL (ref 30.0–100.0)

## 2015-10-25 NOTE — Evaluation (Signed)
Occupational Therapy Evaluation Patient Details Name: Allen Sims MRN: [id number] DOB: 1991/03/02 Today's Date: 10/25/2015    History of Present Illness REPAIR OF LEFT PELVIC NONUNION ILIAC CREST BONE AUTOGRAFTING (Left)   Clinical Impression   Pt with decline in function with ADLs and ADL mobility with decreased balance and activity tolerance. Pt would benefit from acute OT services to address impairments to increase level of function and safety to return home.    Follow Up Recommendations  Home health OT;Supervision/Assistance - 24 hour    Equipment Recommendations  Tub/shower seat;Other (comment) (ADL A/E WPY:KDXIPJA, sock aid, LH sponge, LH shoe horn)    Recommendations for Other Services       Precautions / Restrictions Restrictions Weight Bearing Restrictions: Yes LLE Weight Bearing: Partial weight bearing LLE Partial Weight Bearing Percentage or Pounds: 50      Mobility Bed Mobility Overal bed mobility: Needs Assistance Bed Mobility: Supine to Sit     Supine to sit: HOB elevated;Min assist     General bed mobility comments: min A to assist L LE to EOB  Transfers Overall transfer level: Needs assistance Equipment used: Rolling walker (2 wheeled) Transfers: Sit to/from Stand Sit to Stand: Min guard              Balance Overall balance assessment: Needs assistance Sitting-balance support: No upper extremity supported;Feet supported Sitting balance-Leahy Scale: Fair     Standing balance support: During functional activity Standing balance-Leahy Scale: Poor                              ADL Overall ADL's : Needs assistance/impaired     Grooming: Wash/dry hands;Wash/dry face;Set up;Sitting   Upper Body Bathing: Set up;Sitting   Lower Body Bathing: Moderate assistance   Upper Body Dressing : Set up;Sitting   Lower Body Dressing: Maximal assistance   Toilet Transfer: Min guard;+2 for safety/equipment Toilet Transfer Details  (indicate cue type and reason): simulated Toileting- Clothing Manipulation and Hygiene: Maximal assistance       Functional mobility during ADLs: Min guard;+2 for safety/equipment       Vision  no visual deficits              Pertinent Vitals/Pain Pain Assessment: 0-10 Pain Score: 10-Worst pain ever Pain Location: L side Pain Descriptors / Indicators: Constant;Shooting Pain Intervention(s): Limited activity within patient's tolerance;Monitored during session;Premedicated before session;Repositioned     Hand Dominance Right   Extremity/Trunk Assessment Upper Extremity Assessment Upper Extremity Assessment: Generalized weakness   Lower Extremity Assessment Lower Extremity Assessment: Defer to PT evaluation   Cervical / Trunk Assessment Cervical / Trunk Assessment: Normal   Communication Communication Communication: No difficulties   Cognition Arousal/Alertness: Awake/alert Behavior During Therapy: WFL for tasks assessed/performed Overall Cognitive Status: Within Functional Limits for tasks assessed                     General Comments   pt very pleasant, cooperative and jovial                 Home Living Family/patient expects to be discharged to:: Private residence Living Arrangements: Parent Available Help at Discharge: Family;Friend(s) Type of Home: House Home Access: Stairs to enter [employer] of Steps: 4 Entrance Stairs-Rails: None Home Layout: Two level;Able to live on main level with bedroom/bathroom;Full bath on main level Alternate Level Stairs-Number of Steps: flight   Bathroom Shower/Tub: Teacher, early years/pre:  Standard     Home Equipment: Bedside commode          Prior Functioning/Environment Level of Independence: Independent             OT Diagnosis: Acute pain   OT Problem List: Pain;Impaired balance (sitting and/or standing);Decreased activity tolerance;Decreased knowledge of use of DME or  AE   OT Treatment/Interventions: Self-care/ADL training;Patient/family education;Therapeutic activities;DME and/or AE instruction    OT Goals(Current goals can be found in the care plan section) Acute Rehab OT Goals Patient Stated Goal: go home OT Goal Formulation: With patient Time For Goal Achievement: 11/01/15 Potential to Achieve Goals: Good ADL Goals Pt Will Perform Grooming: with supervision;with set-up;standing Pt Will Perform Lower Body Bathing: with min assist;with adaptive equipment;with caregiver independent in assisting;sitting/lateral leans;sit to/from stand Pt Will Perform Lower Body Dressing: with mod assist;with adaptive equipment;with caregiver independent in assisting;sitting/lateral leans;sit to/from stand Pt Will Transfer to Toilet: with supervision;ambulating;grab bars;regular height toilet (3 in 1 over toilet) Pt Will Perform Toileting - Clothing Manipulation and hygiene: with min assist;sit to/from stand Pt Will Perform Tub/Shower Transfer: with min guard assist;with supervision;shower seat;3 in 1  OT Frequency: Min 2X/week   Barriers to D/C:  none          Co-evaluation PT/OT/SLP Co-Evaluation/Treatment: Yes Reason for Co-Treatment: For patient/therapist safety   OT goals addressed during session: ADL's and self-care      End of Session Equipment Utilized During Treatment: Rolling walker Nurse Communication: Mobility status  Activity Tolerance: Patient limited by pain Patient left: in chair;with call bell/phone within reach;with family/visitor present   Time: 1401-1425 OT Time Calculation (min): 24 min Charges:  OT General Charges $OT Visit: 1 Procedure OT Evaluation $OT Eval Moderate Complexity: 1 Procedure G-Codes: OT G-codes **NOT FOR INPATIENT CLASS** Functional Limitation: Self care Self Care Current Status (O4784): At least 40 percent but less than 60 percent impaired, limited or restricted Self Care Goal Status (X2820): At least 20 percent  but less than 40 percent impaired, limited or restricted  Allen Sims 10/25/2015, 2:38 PM

## 2015-10-25 NOTE — Progress Notes (Signed)
Orthopaedic Trauma Service Progress Note  Subjective  Doing ok this am Significant pain suprapubic area Pain tolerable with combo of IV and PO meds Tolerating diet + flatus Foley still in   Has not been out of bed yet  Using IS   Denies CP, SOB, N/V No abd pain   ROS As above   Objective   BP 115/72 mmHg  Pulse 87  Temp(Src) 98.6 F (37 C) (Oral)  Resp 17  Ht 5\' 10"  (1.778 m)  Wt 88.451 kg (195 lb)  BMI 27.98 kg/m2  SpO2 97%  Intake/Output      06/27 0701 - 06/28 0700 06/28 0701 - 06/29 0700   P.O. 150    I.V. (mL/kg) 3500 (39.6)    IV Piggyback 200    Total Intake(mL/kg) 3850 (43.5)    Urine (mL/kg/hr) 1420    Blood 65    Total Output 1485     Net +2365            Labs  Results for Marty HeckGALLAGHER, Allen T (MRN [id number]) as of 10/25/2015 08:16  Ref. Range 10/24/2015 11:44  CRP Latest Ref Range: <1.0 mg/dL 0.9  Vitamin D, 40-[id number]-Hydroxy Latest Ref Range: 30.0-100.0 ng/mL 33.6  Results for Marty HeckGALLAGHER, Allen T (MRN [id number]) as of 10/25/2015 08:16  Ref. Range 10/24/2015 11:44  AST Latest Ref Range: 15-41 U/L 23  ALT Latest Ref Range: 17-63 U/L 32  Total Protein Latest Ref Range: 6.5-8.1 g/dL 7.6  Total Bilirubin Latest Ref Range: 0.3-1.2 mg/dL 0.8   Results for Marty HeckGALLAGHER, Allen T (MRN [id number]) as of 10/25/2015 08:16  Ref. Range 10/24/2015 16:57  Amphetamines Latest Ref Range: NONE DETECTED  NONE DETECTED  Barbiturates Latest Ref Range: NONE DETECTED  NONE DETECTED  Benzodiazepines Latest Ref Range: NONE DETECTED  NONE DETECTED  Opiates Latest Ref Range: NONE DETECTED  NONE DETECTED  COCAINE Latest Ref Range: NONE DETECTED  POSITIVE (A)  Tetrahydrocannabinol Latest Ref Range: NONE DETECTED  NONE DETECTED   Exam  Gen: resting comfortably in bed, NAD  Lungs: clear anterior fields  Cardiac: s1 and s2, RRR Abd: + BS, NTND Pelvis: dressing c/d/i  Suprapubic swelling  No significant scrotal swelling  Foley in place and patent. 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 and repair of nonunion  Partial WB L leg (about 50 % BW)  No ROM restrictions  Ice PRN  PT/OT evals  Dressing change tomorrow     - Pain management:  Minimize IV pain meds  Use PO meds primarily   - ABL anemia/Hemodynamics  Minimal blood loss yesterday  Vitals stable  - DVT/PE prophylaxis:  Lovenox as inpatient  Will dc home with 21 days of lovenox   - ID:   periop abx   - Metabolic Bone Disease:  Labs pending  25 OH vit D is ok   - Activity:  OOB with therapy   PWB L leg about 50 %   Walker or crutches   - FEN/GI prophylaxis/Foley/Lines:  Diet as tolerated  Dc foley this am   Monitor for retention    Pt has some baseline GU dysfunction related to original injury   Will consult Urology if needed for recommendations  - misc  Toxicology  screen + for cocaine  Serum confirmation ordered  Does not appear that current home meds would have caused false positive  Family in room at time of eval so unable to speak with pt one-on-one   Will try to discuss tomorrow with pt    - Dispo:  Therapy evals  Probable dc home tomorrow     Mearl LatinKeith W. Marijayne Rauth, PA-C [facility] [phone](P) [phone] (O) 10/25/2015 8:15 AM
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
## 2015-10-25 NOTE — Evaluation (Signed)
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 Pt's initial injuries were sustained following fall from a trunk and truck hitting his pelvis while at work.   Clinical Impression  Pt admitted with above diagnosis. Pt currently with functional limitations due to the deficits listed below (see PT Problem List). At the time of PT eval pt was able to perform transfers and ambulation with close guard for safety and +2 for chair follow. Pain is the main limiting factor for mobility at this time. Pt will benefit from skilled PT to increase their independence and safety with mobility to allow discharge to the venue listed below.       Follow Up Recommendations Home health PT;Supervision for mobility/OOB    Equipment Recommendations  Rolling walker with 5" wheels    Recommendations for Other Services       Precautions / Restrictions Precautions Precautions: Fall Restrictions Weight Bearing Restrictions: Yes LLE Weight Bearing: Partial weight bearing LLE Partial Weight Bearing Percentage or Pounds: 50      Mobility  Bed Mobility Overal bed mobility: Needs Assistance Bed Mobility: Supine to Sit     Supine to sit: HOB elevated;Min assist     General bed mobility comments: min A to assist L LE to EOB  Transfers Overall transfer level: Needs assistance Equipment used: Rolling walker (2 wheeled) Transfers: Sit to/from Stand Sit to Stand: Min guard         General transfer comment: Close guard for safety however pt was able to power-up to full stand without assistance.   Ambulation/Gait Ambulation/Gait assistance: Min guard;+2 safety/equipment (Chair follow) Ambulation Distance (Feet): 20 Feet Assistive device: Rolling walker (2  wheeled) Gait Pattern/deviations: Decreased stride length;Step-to pattern;Trunk flexed Gait velocity: Decreased Gait velocity interpretation: Below normal speed for age/gender General Gait Details: VC's for general sequencing and safety awareness with the RW. Pt appeared to be maintaining PWB status well.   Stairs            Wheelchair Mobility    Modified Rankin (Stroke Patients Only)       Balance Overall balance assessment: Needs assistance Sitting-balance support: Feet supported;No upper extremity supported Sitting balance-Leahy Scale: Fair     Standing balance support: During functional activity Standing balance-Leahy Scale: Poor Standing balance comment: Requires UE support to maintain WB status during dynamic standing activity                             Pertinent Vitals/Pain Pain Assessment: 0-10 Pain Score: 10-Worst pain ever Pain Location: L side of pelvis Pain Descriptors / Indicators: Operative site guarding;Sore Pain Intervention(s): Limited activity within patient's tolerance;Monitored during session;Repositioned    Home Living Family/patient expects to be discharged to:: Private residence Living Arrangements: Parent Available Help at Discharge: Family;Friend(s) Type of Home: House Home Access: Stairs to enter Entrance Stairs-Rails: None Entrance Stairs-Number of Steps: 4 Home Layout: Two level;Able to live on main level with bedroom/bathroom;Full bath on main level Home Equipment: Bedside commode      Prior Function Level of Independence: Independent               Hand Dominance   Dominant Hand: Right    Extremity/Trunk Assessment   Upper Extremity Assessment: Defer  to OT evaluation           Lower Extremity Assessment: Generalized weakness (Limited due to pain)      Cervical / Trunk Assessment: Normal  Communication   Communication: No difficulties  Cognition Arousal/Alertness: Awake/alert Behavior During  Therapy: WFL for tasks assessed/performed Overall Cognitive Status: Within Functional Limits for tasks assessed                      General Comments      Exercises        Assessment/Plan    PT Assessment Patient needs continued PT services  PT Diagnosis Difficulty walking;Acute pain   PT Problem List Decreased strength;Decreased range of motion;Decreased activity tolerance;Decreased balance;Decreased mobility;Decreased knowledge of use of DME;Decreased safety awareness;Decreased knowledge of precautions;Pain  PT Treatment Interventions DME instruction;Gait training;Stair training;Functional mobility training;Therapeutic activities;Therapeutic exercise;Neuromuscular re-education;Patient/family education   PT Goals (Current goals can be found in the Care Plan section) Acute Rehab PT Goals Patient Stated Goal: go home PT Goal Formulation: With patient Time For Goal Achievement: 11/01/15 Potential to Achieve Goals: Good    Frequency Min 5X/week   Barriers to discharge        Co-evaluation   Reason for Co-Treatment: For patient/therapist safety   OT goals addressed during session: ADL's and self-care       End of Session   Activity Tolerance: Patient limited by pain Patient left: in chair;with call bell/phone within reach;with family/visitor present Nurse Communication: Mobility status    Functional Assessment Tool Used: Clinical judgement Functional Limitation: Mobility: Walking and moving around Mobility: Walking and Moving Around Current Status (W0981(G8978): At least 20 percent but less than 40 percent impaired, limited or restricted Mobility: Walking and Moving Around Goal Status [phone](G8979): At least 1 percent but less than 20 percent impaired, limited or restricted    Time: 8295-62131404-1425 PT Time Calculation (min) (ACUTE ONLY): 21 min   Charges:   PT Evaluation $PT Eval Moderate Complexity: 1 Procedure     PT G Codes:   PT G-Codes **NOT FOR INPATIENT  CLASS** Functional Assessment Tool Used: Clinical judgement Functional Limitation: Mobility: Walking and moving around Mobility: Walking and Moving Around Current Status (Y8657(G8978): At least 20 percent but less than 40 percent impaired, limited or restricted Mobility: Walking and Moving Around Goal Status [phone](G8979): At least 1 percent but less than 20 percent impaired, limited or restricted    Conni SlipperKirkman, Ovide Dusek 10/25/2015, 3:37 PM  Conni SlipperLaura Terius Jacuinde, PT, DPT Acute Rehabilitation Services Pager: [phone]

## 2015-10-25 NOTE — Care Management (Addendum)
PT and OT recommending HHPT/OT walker , tub/shower bench and ADL A/E VHQ:[id number], sock aid, LH sponge, LH shoe horn  Left voice mail for Elenor Quinones , workers comp contact , and faxed PT/OT notes to her also as requested earlier today .   Entered orders for above when signed by PA/ MD will also fax. Maggie aware patient needs 21 days of Lovenox and discharge planned for tomorrow 10-26-15 .  Maggie returned phone call and aware of above. Fax went through .  Magdalen Spatz RN BSN [phone]

## 2015-10-25 NOTE — Care Management (Addendum)
Spoke to patient at bedside . Confirmed face sheet information with patient .   Patient provided his Workers Comp contact information : Maggie [phone]. Patient states Workers Comp [facility]. Patient consulted for [facility] to [facility] and [facility] .  Spoke with Maggie via phone . Maggie aware expected discharge date is tomorrow 10-26-15 and patient will need 21 days of Lovenox . Maggie would like PT/OT eval notes faxed to her at [phone] when available.    Maggie has arranged follow up appointment with Dr Carola FrostHandy on 11-17-15 at 1100 am.  Will continue to follow.   Ronny FlurryHeather Colin Ellers RN BSN [phone]

## 2015-10-25 NOTE — Progress Notes (Signed)
Patient notified nurse of swelling and bruising of genital area. Ice pack given to patient and area elevated with towel. Will continue to monitor.

## 2015-10-26 ENCOUNTER — Encounter (HOSPITAL_COMMUNITY): Payer: Self-pay | Admitting: Orthopedic Surgery

## 2015-10-26 LAB — CBC
HCT: 40.1 % (ref 39.0–52.0)
Hemoglobin: 14 g/dL (ref 13.0–17.0)
MCH: 31.2 pg (ref 26.0–34.0)
MCHC: 34.9 g/dL (ref 30.0–36.0)
MCV: 89.3 fL (ref 78.0–100.0)
Platelets: 183 K/uL (ref 150–400)
RBC: 4.49 MIL/uL (ref 4.22–5.81)
RDW: 12 % (ref 11.5–15.5)
WBC: 15.3 K/uL — ABNORMAL HIGH (ref 4.0–10.5)

## 2015-10-26 LAB — BASIC METABOLIC PANEL WITH GFR
Anion gap: 7 (ref 5–15)
BUN: <5 mg/dL — ABNORMAL LOW (ref 6–20)
CO2: 27 mmol/L (ref 22–32)
Calcium: 8.8 mg/dL — ABNORMAL LOW (ref 8.9–10.3)
Chloride: 100 mmol/L — ABNORMAL LOW (ref 101–111)
Creatinine, Ser: 1.02 mg/dL (ref 0.61–1.24)
GFR calc Af Amer: >60 mL/min
GFR calc non Af Amer: >60 mL/min
Glucose, Bld: 121 mg/dL — ABNORMAL HIGH (ref 65–99)
Potassium: 3.5 mmol/L (ref 3.5–5.1)
Sodium: 134 mmol/L — ABNORMAL LOW (ref 135–145)

## 2015-10-26 NOTE — Care Management (Addendum)
Orders for  HHPT/OT walker , tub/shower bench and ADL A/E DAP:TCKFWBL, sock aid, LH sponge, LH shoe horn Faxed to Workers Comp [employer] . Burman Nieves will arrange home health and call patient directly with agency name. Workers Comp has supplied patient with DME in the past . Patient donated everything except for the toilet seat to a church . He understands Workers Comp will not replace any equipment they have already provided. He states his dad can help him with DME. Asked Ms Chrissie Noa to discuss this directly with patient ( not sure what was provided and donated) .   Again Ms Chrissie Noa aware that patient needs 21 days of Lovenox . Patient needs to take prescription to [facility] ( pharmacy he has used in the past with Workers Comp ) and they will cover. Patient aware.  Burman Nieves has spoken directly to patient and ordered rolling walker [facility] .They will call patient directly for delivery.  Magdalen Spatz RN BSN [phone]

## 2015-10-26 NOTE — Progress Notes (Signed)
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 Pt's initial injuries were sustained following fall from a trunk and truck hitting his pelvis while at work.    OT comments  Pt making progress with functional goals. Pt continues to report high level of pain despite pain meds being given prior to session, however pt moving better today and was bale to ambulate to bathroom for toileting x 2. Pt with positive attitude and cooperative. OT will continue to follow acutely  Follow Up Recommendations  Home health OT;Supervision/Assistance - 24 hour    Equipment Recommendations  Tub/shower seat;Other (comment) (ADL A/E kit)    Recommendations for Other Services      Precautions / Restrictions Precautions Precautions: Fall Restrictions Weight Bearing Restrictions: Yes LLE Weight Bearing: Partial weight bearing LLE Partial Weight Bearing Percentage or Pounds: 50       Mobility Bed Mobility Overal bed mobility: Needs Assistance Bed Mobility: Supine to Sit;Sit to Supine     Supine to sit: HOB elevated;Min assist Sit to supine: Min assist   General bed mobility comments: min A to assist L LE to EOB and back onto bed  Transfers Overall transfer level: Needs assistance Equipment used: Rolling walker (2 wheeled) Transfers: Sit to/from Stand Sit to Stand: Min guard              Balance   Sitting-balance support: No upper extremity supported;Feet supported Sitting balance-Leahy Scale: Good     Standing balance support: During functional activity Standing balance-Leahy Scale: Fair                     ADL       Grooming: Wash/dry hands;Wash/dry face;Supervision/safety;Standing       Lower Body Bathing: Moderate  assistance;Minimal assistance;Sitting/lateral leans       Lower Body Dressing: Maximal assistance Lower Body Dressing Details (indicate cue type and reason): reviewd ADL A/E for home use for LB ADLs Toilet Transfer: Min guard;Comfort height toilet;RW;Ambulation   Toileting- Clothing Manipulation and Hygiene: Moderate assistance;Sitting/lateral lean;Sit to/from stand   Tub/ Shower Transfer: 3 in 1;Min guard;Ambulation;Rolling walker   Functional mobility during ADLs: Min guard        Vision  no change from baseline, no visual deficits                              Cognition   Behavior During Therapy: WFL for tasks assessed/performed Overall Cognitive Status: Within Functional Limits for tasks assessed                       Extremity/Trunk Assessment   WFL                        General Comments  pt very pleasant, cooperative and jovial    Pertinent Vitals/ Pain       Pain Assessment: 0-10 Pain Score: 8  Pain Location: L side of pelvis Pain Descriptors / Indicators: Sore;Grimacing;Guarding Pain Intervention(s): Limited activity within patient's tolerance;Monitored during session;Premedicated before session;Repositioned  Home Living  lives at home with his mom  Prior Functioning/Environment  independent           Frequency Min 2X/week     Progress Toward Goals  OT Goals(current goals can now be found in the care plan section)  Progress towards OT goals: Progressing toward goals     Plan Discharge plan remains appropriate    Co                 End of Session Equipment Utilized During Treatment: Rolling walker;Other (comment) (3 in 1)   Activity Tolerance Patient limited by pain   Patient Left with call bell/phone within reach;with family/visitor present;in bed             Time: 6270-3500 OT Time Calculation (min): 33 min  Charges: OT General Charges $OT  Visit: 1 Procedure OT Treatments $Self Care/Home Management : 8-22 mins $Therapeutic Activity: 8-22 mins  Britt Bottom 10/26/2015, 12:12 PM

## 2015-10-26 NOTE — Progress Notes (Signed)
[facility] Progress Note  Subjective  Doing ok  Still with significant pain in pelvic area Did mobilize yesterday but slow + void +flatus  Noted vapor cigarette on bedside table, pt assures me its nicotine free  Nicotine and metabolites test pending   Increased swelling and bruising to scrotum and penis   ROS  No CP or SOB No palpitations  No dyspnea  No headaches   Objective   BP 145/88 mmHg  Pulse 132  Temp(Src) 100 F (37.8 C) (Oral)  Resp 19  Ht '5\' 10"'$  (1.778 m)  Wt 88.451 kg (195 lb)  BMI 27.98 kg/m2  SpO2 95%  Intake/Output      06/28 0701 - 06/29 0700 06/29 0701 - 06/30 0700   P.O. 720    I.V. (mL/kg) 549.2 (6.2)    IV Piggyback 50    Total Intake(mL/kg) 1319.2 (14.9)    Urine (mL/kg/hr) 2020 (1)    Blood     Total Output 2020     Net -700.8          Urine Occurrence 5 x      Labs Results for Allen, Sims (MRN [id number]) as of 10/26/2015 09:28  Ref. Range 10/26/2015 05:00  Sodium Latest Ref Range: 135-145 mmol/L 134 (L)  Potassium Latest Ref Range: 3.5-5.1 mmol/L 3.5  Chloride Latest Ref Range: 101-111 mmol/L 100 (L)  CO2 Latest Ref Range: 22-32 mmol/L 27  BUN Latest Ref Range: 6-20 mg/dL <5 (L)  Creatinine Latest Ref Range: 0.61-1.24 mg/dL 1.02  Calcium Latest Ref Range: 8.9-10.3 mg/dL 8.8 (L)  EGFR (Non-African Amer.) Latest Ref Range: >60 mL/min >60  EGFR (African American) Latest Ref Range: >60 mL/min >60  Glucose Latest Ref Range: 65-99 mg/dL 121 (H)  Anion gap Latest Ref Range: 5-15  7    Exam  Gen: awake and alert, NAD, lying in bed Lungs: clear anterior fields Cardiac: tachy but regular  Abd: + BS Pelvis: + suprapubic swelling, + scrotal swelling   Dressings changed  Incision lines are stable  No active drainage  Mild erythema in splotchy pattern around lower abdomen, looks like irritation from dressing and steristrips   No purulence  Motor and sensory functions at baseline             + DP pulses      Assessment and Plan   POD/HD#:   25 y/o male s/p LC 3 pelvic ring fx a little over 1 year ago with 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   -symptomatic hardware B SI screws/ L pubic rami nonunion following LC 3 pelvic ring fracture s/p ROH and repair of nonunion             Partial WB L leg (about 50 % BW)             No ROM restrictions             Ice PRN             PT/OT              Dressings changed   Can change PRN  Ok for pt to shower                - Pain management:             Minimize IV pain meds             Use PO meds primarily   - ABL anemia/Hemodynamics  pt is tachy  Check CBC  Monitor vital signs  Pt w/o symptoms  Will increase fluid rate as well to 100 cc/hr   - DVT/PE prophylaxis:             Lovenox as inpatient             Will dc home with 21 days of lovenox              - ID:               periop abx   - Metabolic Bone Disease:             Labs pending             25 OH vit D and calcitriol levels are ok  Nicotine pending   - Activity:             OOB with therapy               PWB L leg about 50 %               Walker or crutches              - FEN/GI prophylaxis/Foley/Lines:             Diet as tolerated             voiding well   - misc             Toxicology screen + for cocaine             Serum confirmation ordered             Does not appear that current home meds would have caused false positive             Family/friends in room at time of eval so unable to speak with pt one-on-one               Will try to discuss tomorrow with pt    - Dispo:            continue therapy  Dc home tomorrow     Jari Pigg, PA-C [facility] [phone] (O) 10/26/2015 9:24 AM

## 2015-10-26 NOTE — Progress Notes (Signed)
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 Pt's initial injuries were sustained following fall from a trunk and truck hitting his pelvis while at work.     PT Comments    Pt making progress with mobility and able to increase ambulation distance while continuing to do well maintaining PWBing status.  Discussed performing stairs which pt requested deferring until tomorrow.  Pt also indicated that he does have a railing at his front steps and could use that if needed.    Follow Up Recommendations  Home health PT;Supervision for mobility/OOB     Equipment Recommendations  Rolling walker with 5" wheels    Recommendations for Other Services       Precautions / Restrictions Precautions Precautions: Fall Restrictions Weight Bearing Restrictions: Yes LLE Weight Bearing: Partial weight bearing LLE Partial Weight Bearing Percentage or Pounds: 50%    Mobility  Bed Mobility Overal bed mobility: Needs Assistance Bed Mobility: Sit to Supine     Supine to sit: HOB elevated;Min assist Sit to supine: Min assist   General bed mobility comments: A with L LE only.  pt able to bring trunk into bed.    Transfers Overall transfer level: Needs assistance Equipment used: Rolling walker (2 wheeled) Transfers: Sit to/from Stand Sit to Stand: Min guard         General transfer comment: Guarding for safety, but no physical A needed.    Ambulation/Gait Ambulation/Gait assistance: Min guard Ambulation Distance (Feet): 200 Feet Assistive device: Rolling walker (2 wheeled) Gait Pattern/deviations: Step-to pattern;Decreased step length - right;Decreased stance time - left     General Gait Details: pt with definite use of UEs, but does a good  job maintaining PWBing.  pt fatigued from ambulation.     Stairs            Wheelchair Mobility    Modified Rankin (Stroke Patients Only)       Balance Overall balance assessment: Needs assistance Sitting-balance support: No upper extremity supported;Feet supported Sitting balance-Leahy Scale: Good     Standing balance support: Bilateral upper extremity supported;No upper extremity supported;During functional activity Standing balance-Leahy Scale: Fair                      Cognition Arousal/Alertness: Awake/alert Behavior During Therapy: WFL for tasks assessed/performed Overall Cognitive Status: Within Functional Limits for tasks assessed                      Exercises      General Comments        Pertinent Vitals/Pain Pain Assessment: Faces Pain Score: 8  Faces Pain Scale: Hurts little more Pain Location: L pelvis and hip Pain Descriptors / Indicators: Aching;Grimacing;Guarding Pain Intervention(s): Monitored during session;Premedicated before session;Repositioned    Home Living                      Prior Function            PT Goals (current goals can now be found in the care plan section) Acute Rehab PT Goals Patient Stated Goal: go home PT Goal Formulation: With patient Time For Goal Achievement: 11/01/15 Potential to Achieve Goals: Good Progress towards PT goals: Progressing toward goals  Frequency  Min 5X/week    PT Plan Current plan remains appropriate    Co-evaluation             End of Session   Activity Tolerance: Patient tolerated treatment well Patient left: in bed;with call bell/phone within reach;with family/visitor present     Time: 1610-96041426-1452 PT Time Calculation (min) (ACUTE ONLY): 26 min  Charges:  $Gait Training: 23-37 mins                    G Codes:      Shereese Bonnie F 10/26/2015, 3:13 PM

## 2015-10-27 ENCOUNTER — Encounter (HOSPITAL_COMMUNITY): Payer: Self-pay | Admitting: Orthopedic Surgery

## 2015-10-27 DIAGNOSIS — G8929 Other chronic pain: Secondary | ICD-10-CM | POA: Diagnosis present

## 2015-10-27 DIAGNOSIS — G5732 Lesion of lateral popliteal nerve, left lower limb: Secondary | ICD-10-CM | POA: Diagnosis present

## 2015-10-27 DIAGNOSIS — K219 Gastro-esophageal reflux disease without esophagitis: Secondary | ICD-10-CM | POA: Diagnosis present

## 2015-10-27 DIAGNOSIS — T8484XA Pain due to internal orthopedic prosthetic devices, implants and grafts, initial encounter: Secondary | ICD-10-CM | POA: Diagnosis present

## 2015-10-27 DIAGNOSIS — G629 Polyneuropathy, unspecified: Secondary | ICD-10-CM

## 2015-10-27 DIAGNOSIS — F4310 Post-traumatic stress disorder, unspecified: Secondary | ICD-10-CM | POA: Diagnosis present

## 2015-10-27 DIAGNOSIS — F419 Anxiety disorder, unspecified: Secondary | ICD-10-CM | POA: Diagnosis present

## 2015-10-27 DIAGNOSIS — F1911 Other psychoactive substance abuse, in remission: Secondary | ICD-10-CM | POA: Diagnosis present

## 2015-10-27 DIAGNOSIS — Z789 Other specified health status: Secondary | ICD-10-CM

## 2015-10-27 HISTORY — DX: Other specified health status: Z78.9

## 2015-10-27 HISTORY — DX: Lesion of lateral popliteal nerve, left lower limb: G57.32

## 2015-10-27 LAB — BASIC METABOLIC PANEL
ANION GAP: 9 (ref 5–15)
BUN: <5 mg/dL — ABNORMAL LOW (ref 6–20)
CALCIUM: 8.9 mg/dL (ref 8.9–10.3)
CO2: 26 mmol/L (ref 22–32)
Chloride: 98 mmol/L — ABNORMAL LOW (ref 101–111)
Creatinine, Ser: 0.9 mg/dL (ref 0.61–1.24)
Glucose, Bld: 115 mg/dL — ABNORMAL HIGH (ref 65–99)
Potassium: 3.5 mmol/L (ref 3.5–5.1)
Sodium: 133 mmol/L — ABNORMAL LOW (ref 135–145)

## 2015-10-27 LAB — HIV ANTIBODY (ROUTINE TESTING W REFLEX): HIV SCREEN 4TH GENERATION: NONREACTIVE

## 2015-10-27 LAB — HEPATITIS PANEL, ACUTE
HCV Ab: <0.1 s/co ratio (ref 0.0–0.9)
HEP B C IGM: NEGATIVE
Hep A IgM: NEGATIVE
Hepatitis B Surface Ag: NEGATIVE

## 2015-10-27 MED ORDER — DOCUSATE SODIUM 100 MG PO CAPS: 100.0000 mg | ORAL_CAPSULE | Freq: Two times a day (BID) | ORAL | Status: DC

## 2015-10-27 MED ORDER — ENOXAPARIN (LOVENOX) PATIENT EDUCATION KIT
PACK | Freq: Once | Status: AC
  Administered 2015-10-27: 16:00:00
  Filled 2015-10-27: qty 1

## 2015-10-27 MED ORDER — DOXYCYCLINE HYCLATE 100 MG PO TABS: 100.0000 mg | ORAL_TABLET | Freq: Two times a day (BID) | ORAL | Status: DC

## 2015-10-27 MED ORDER — HYDROCODONE-ACETAMINOPHEN 10-325 MG PO TABS
1.0000 | ORAL_TABLET | Freq: Four times a day (QID) | ORAL | Status: DC | PRN
  Administered 2015-10-27 – 2015-10-28 (×5): 2 via ORAL
  Filled 2015-10-27 (×5): qty 2

## 2015-10-27 MED ORDER — ENOXAPARIN SODIUM 40 MG/0.4ML ~~LOC~~ SOLN: 40.0000 mg | SUBCUTANEOUS | Status: DC

## 2015-10-27 MED ORDER — HYDROCODONE-ACETAMINOPHEN 10-325 MG PO TABS: 1.0000 | ORAL_TABLET | Freq: Four times a day (QID) | ORAL | Status: DC | PRN

## 2015-10-27 MED ORDER — TRAMADOL HCL 50 MG PO TABS: 50.0000 mg | ORAL_TABLET | Freq: Four times a day (QID) | ORAL | Status: DC | PRN

## 2015-10-27 MED ORDER — DOXYCYCLINE HYCLATE 100 MG PO TABS
100.0000 mg | ORAL_TABLET | Freq: Two times a day (BID) | ORAL | Status: DC
Start: 2015-10-27 — End: 2015-10-28
  Administered 2015-10-27 – 2015-10-28 (×3): 100 mg via ORAL
  Filled 2015-10-27 (×3): qty 1

## 2015-10-27 MED ORDER — MAGNESIUM CITRATE PO SOLN: 1.0000 | Freq: Once | ORAL | Status: DC | PRN

## 2015-10-27 MED ORDER — METHOCARBAMOL 500 MG PO TABS: 1000.0000 mg | ORAL_TABLET | Freq: Four times a day (QID) | ORAL | Status: DC

## 2015-10-27 NOTE — Progress Notes (Signed)
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 Pt's initial injuries were sustained following fall from a trunk and truck hitting his pelvis while at work.     PT Comments    Pt endorses increased pain today in L groin and medial thigh area, but was able to perform ambulation and stair gait with only MinG.  Pt is ready for return to home from a PT stand point.    Follow Up Recommendations  Home health PT;Supervision for mobility/OOB     Equipment Recommendations  Rolling walker with 5" wheels    Recommendations for Other Services       Precautions / Restrictions Precautions Precautions: Fall Restrictions Weight Bearing Restrictions: Yes LLE Weight Bearing: Partial weight bearing LLE Partial Weight Bearing Percentage or Pounds: 50%    Mobility  Bed Mobility               General bed mobility comments: pt sitting EOB on arrival and left sitting EOB with friend in room.    Transfers Overall transfer level: Needs assistance Equipment used: Rolling walker (2 wheeled) Transfers: Sit to/from Stand Sit to Stand: Min guard         General transfer comment: Guarding for safety, but no physical A needed.    Ambulation/Gait Ambulation/Gait assistance: Min guard Ambulation Distance (Feet): 200 Feet Assistive device: Rolling walker (2 wheeled) Gait Pattern/deviations: Step-to pattern;Decreased step length - right;Decreased stance time - left     General Gait Details: pt with definite use of UEs, but does a good job maintaining PWBing.  pt fatigued from ambulation.     Stairs Stairs: Yes Stairs assistance: Min guard Stair Management: One rail Right;Step to pattern;Sideways Number of Stairs: 4 General stair comments: pt  ed on gait sequencing on stairs and use of R hand raill.  pt with good follow through on cueing.    Wheelchair Mobility    Modified Rankin (Stroke Patients Only)       Balance Overall balance assessment: Needs assistance Sitting-balance support: No upper extremity supported;Feet supported Sitting balance-Leahy Scale: Good     Standing balance support: Bilateral upper extremity supported;No upper extremity supported;During functional activity Standing balance-Leahy Scale: Fair                      Cognition Arousal/Alertness: Awake/alert Behavior During Therapy: WFL for tasks assessed/performed Overall Cognitive Status: Within Functional Limits for tasks assessed                      Exercises      General Comments        Pertinent Vitals/Pain Pain Assessment: 0-10 Pain Score:  (12 L groin and medial thigh) Pain Location: L groin and medial thigh Pain Descriptors / Indicators: Aching;Grimacing;Guarding;Sharp Pain Intervention(s): Monitored during session;Repositioned;Premedicated before session    Home Living                      Prior Function            PT Goals (current goals can now be found in the care plan section) Acute Rehab PT Goals Patient Stated Goal: go home PT Goal Formulation: With patient Time For Goal Achievement: 11/01/15 Potential to Achieve Goals: Good Progress  towards PT goals: Progressing toward goals    Frequency  Min 5X/week    PT Plan Current plan remains appropriate    Co-evaluation             End of Session Equipment Utilized During Treatment: Gait belt Activity Tolerance: Patient tolerated treatment well Patient left: in bed;with call bell/phone within reach;with family/visitor present (sitting EOB)     Time: 1610-96041323-1354 PT Time Calculation (min) (ACUTE ONLY): 31 min  Charges:  $Gait Training: 23-37 mins                    G CodesSunny Schlein:      Adelfa Lozito F, [place]PT [phone] 10/27/2015, 2:03  PM

## 2015-10-27 NOTE — Discharge Summary (Signed)
Orthopaedic Trauma Service (OTS)  Patient ID: SAHAJ BONA MRN: [id number] DOB/AGE: [birth year] 25 y.o.  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 Marcelino Scot  1. Removal of bilateral sacroiliac screws. 2. Repair of left superior ramus, nonunion using iliac crest     autografting   Discharged Condition: good   Hospital Course:   25 year old white male well known to the orthopedic trauma service and admitted on 10/24/2015 to address left superior pubic ramus nonunion secondary to [place] 3 pelvic ring injury as well as removal of hardware from his sacrum. Patient had an expected hospital course. Pain was a limiting factor in terms of mobilization. He did require IV pain medication for a couple days postoperatively to adequately address his pain then was converted to oral pain medication. He was covered with Lovenox for DVT PE prophylaxis starting on postoperative day #1. We did obtain intraoperative cultures from his nonunion site which on postoperative day #3 throughout coag negative staph. He was started on doxycycline 100 mg twice a day. On postoperative day #4 his sensitivities did come back which demonstrated that the organism was sensitive to quinolones. Thus he was transitioned to  Levaquin 750 mg po daily at the time of discharge and will continue on this antibiotic for 6 weeks. Patient does not have any additional hardware in his pelvis thus we will not add rifampin.  Patient did work with the PT and OT during his hospital stay and progressed accordingly. We did anticipate him having significant amounts of pain given the invasive nature of this procedure. On postoperative day #4 patient was deemed stable for discharge home. His Foley catheter was discontinued on postoperative day #1 and he has been voiding without any difficulty. He has been tolerating a regular diet. He has had some mild intermittent tachycardia but has never had any symptoms related to this. The patient was not started on any beta blockers. We have just monitor him and given him fluids which have seemed to work just fine.  Patient discharged in stable condition on postoperative day #4. He will follow-up with orthopedics in 10-14 days   Consults: None  Significant Diagnostic Studies: labs:   Aerobic/Anaerobic Culture (surgical/deep wound)  Status: Preliminary result     Visible to patient:  Not Released     Next appt: None              4d ago     Specimen Description WOUND    Special Requests PELVIC NON UNION SITE SWABS    Gram Stain NO WBC SEEN  NO ORGANISMS SEEN  RESULT CALLED TO, READ BACK BY AND VERIFIED WITH: L CAMPBELL 10/24/15 @ 1614 M VESTAL        Culture RARE STAPHYLOCOCCUS SPECIES (COAGULASE NEGATIVE)  NO ANAEROBES ISOLATED; CULTURE IN PROGRESS FOR 5 DAYS  Report Status PENDING    Organism ID, Bacteria STAPHYLOCOCCUS SPECIES (COAGULASE NEGATIVE)    Resulting Agency [facility]      Culture & Susceptibility         STAPHYLOCOCCUS SPECIES (COAGULASE NEGATIVE)       Antibiotic Sensitivity Microscan Status      CIPROFLOXACIN Sensitive <=0.5 SENSITIVE Final      Method: MIC      CLINDAMYCIN Resistant >=8 RESISTANT Final      Method: MIC      ERYTHROMYCIN Resistant >=8 RESISTANT  Final      Method: MIC      GENTAMICIN Sensitive <=0.5 SENSITIVE Final      Method: MIC      Inducible Clindamycin Sensitive NEGATIVE Final      Method: MIC      OXACILLIN Resistant >=4 RESISTANT Final      Method: MIC      RIFAMPIN Sensitive <=0.5 SENSITIVE Final      Method: MIC      TETRACYCLINE Sensitive 2 SENSITIVE Final      Method: MIC      TRIMETH/SULFA Sensitive <=10 SENSITIVE Final      Method: MIC      VANCOMYCIN Sensitive 2 SENSITIVE Final      Method: MIC      Comments STAPHYLOCOCCUS SPECIES (COAGULASE NEGATIVE) (MIC)     RARE STAPHYLOCOCCUS SPECIES (COAGULASE NEGATIVE)               Results for CHEZ, BULNES (MRN [id number]) as of 10/27/2015 09:51  Ref. Range 10/26/2015 11:28 10/27/2015 06:02  Sodium Latest Ref Range: 135-145 mmol/L  133 (L)  Potassium Latest Ref Range: 3.5-5.1 mmol/L  3.5  Chloride Latest Ref Range: 101-111 mmol/L  98 (L)  CO2 Latest Ref Range: 22-32 mmol/L  26  BUN Latest Ref Range: 6-20 mg/dL  <5 (L)  Creatinine Latest Ref Range: 0.61-1.24 mg/dL  0.90  Calcium Latest Ref Range: 8.9-10.3 mg/dL  8.9  EGFR (Non-African Amer.) Latest Ref Range: >60 mL/min  >60  EGFR (African American) Latest Ref Range: >60 mL/min  >60  Glucose Latest Ref Range: 65-99 mg/dL  115 (H)  Anion gap Latest Ref Range: 5-15   9  WBC Latest Ref Range: 4.0-10.5 K/uL 15.3 (H)   RBC Latest Ref Range: 4.22-5.81 MIL/uL 4.49   Hemoglobin Latest Ref Range: 13.0-17.0 g/dL 14.0   HCT Latest Ref Range: 39.0-52.0 % 40.1   MCV Latest Ref Range: 78.0-100.0 fL 89.3   MCH Latest Ref Range: 26.0-34.0 pg 31.2   MCHC Latest Ref Range: 30.0-36.0 g/dL 34.9   RDW Latest Ref Range: 11.5-15.5 % 12.0   Platelets Latest Ref Range: 150-400 K/uL 183   Hep A Ab, IgM Latest Ref Range: Negative  Negative   Hepatitis B Surface Ag Latest Ref Range: Negative  Negative   Hep B Core Ab, IgM Latest Ref Range: Negative  Negative   HCV Ab Latest Ref Range: 0.0-0.9 s/co ratio <0.1   HIV Latest Ref  Range: Non Reactive  Non Reactive    Results for BRENNDEN, MASTEN (MRN [id number]) as of 10/27/2015 09:51  Ref. Range 10/24/2015 11:44  Alkaline Phosphatase Latest Ref Range: 38-126 U/L 66  Albumin Latest Ref Range: 3.5-5.0 g/dL 4.4  AST Latest Ref Range: 15-41 U/L 23  ALT Latest Ref Range: 17-63 U/L 32  Total Protein Latest Ref Range: 6.5-8.1 g/dL 7.6  Total Bilirubin Latest Ref Range: 0.3-1.2 mg/dL 0.8  CRP Latest Ref Range: <1.0 mg/dL 0.9  Vit D, 1,25-Dihydroxy Latest Ref Range: 19.9-79.3 pg/mL 45.6  Vitamin D, 25-Hydroxy Latest Ref Range: 30.0-100.0 ng/mL 33.6   Results for QUENTIN, SHOREY (MRN [id number]) as of 10/27/2015 09:51  Ref. Range 10/24/2015 16:57  Amphetamines Latest Ref Range: NONE DETECTED  NONE DETECTED  Barbiturates Latest Ref Range: NONE DETECTED  NONE DETECTED  Benzodiazepines Latest Ref Range: NONE DETECTED  NONE DETECTED  Opiates Latest Ref Range: NONE DETECTED  NONE DETECTED  COCAINE Latest Ref Range: NONE DETECTED  POSITIVE (A)  Tetrahydrocannabinol Latest Ref Range: NONE DETECTED  NONE DETECTED   Treatments: IV hydration, antibiotics: clindaymycin (periop), doxycycline, analgesia: Norco, ultram, robaxin, anticoagulation: LMW heparin, therapies: PT, OT and RN and surgery: as above   Discharge Exam:     Orthopaedic Trauma Service Progress Note  Subjective  Feeling better today Ready to go   Pain decreased Worked well with PT yesterday   States his back feels different but good since [facility]   Voiding well + flatus   Review of Systems  Constitutional: Negative for fever and chills.  Eyes: Negative for double vision.  Respiratory: Negative for shortness of breath.   Cardiovascular: Negative for chest pain and palpitations.  Gastrointestinal: Negative for nausea and vomiting.     Objective   BP 140/79 mmHg  Pulse 101  Temp(Src) 98.6 F (37 C) (Oral)  Resp 18  Ht '5\' 10"'$  (1.778 m)  Wt 88.451 kg (195 lb)  BMI 27.98 kg/m2  SpO2  98%  Intake/Output       06/30 0701 - 07/01 0700 07/01 0701 - 07/02 0700    P.O. 700     I.V. (mL/kg)      Total Intake(mL/kg) 700 (7.9)     Net +700            Urine Occurrence 9 x       Labs    Aerobic/Anaerobic Culture (surgical/deep wound)  Status: Preliminary result     Visible to patient:  Not Released     Next appt: None              4d ago     Specimen Description WOUND    Special Requests PELVIC NON UNION SITE SWABS    Gram Stain NO WBC SEEN  NO ORGANISMS SEEN  RESULT CALLED TO, READ BACK BY AND VERIFIED WITH: L CAMPBELL 10/24/15 @ 1614 M VESTAL        Culture RARE STAPHYLOCOCCUS SPECIES (COAGULASE NEGATIVE)  NO ANAEROBES ISOLATED; CULTURE IN PROGRESS FOR 5 DAYS        Report Status PENDING    Organism ID, Bacteria STAPHYLOCOCCUS SPECIES (COAGULASE NEGATIVE)    Resulting Agency [facility]      Culture & Susceptibility         STAPHYLOCOCCUS SPECIES (COAGULASE NEGATIVE)       Antibiotic Sensitivity Microscan Status      CIPROFLOXACIN Sensitive <=0.5 SENSITIVE Final      Method: MIC      CLINDAMYCIN Resistant >=8 RESISTANT Final      Method: MIC      ERYTHROMYCIN Resistant >=8 RESISTANT Final      Method: MIC      GENTAMICIN Sensitive <=0.5 SENSITIVE Final      Method: MIC      Inducible Clindamycin Sensitive NEGATIVE Final      Method: MIC      OXACILLIN Resistant >=4 RESISTANT Final      Method: MIC      RIFAMPIN Sensitive <=0.5 SENSITIVE Final  Method: MIC      TETRACYCLINE Sensitive 2 SENSITIVE Final      Method: MIC      TRIMETH/SULFA Sensitive <=10 SENSITIVE Final      Method: MIC      VANCOMYCIN Sensitive 2 SENSITIVE Final      Method: MIC      Comments STAPHYLOCOCCUS SPECIES (COAGULASE NEGATIVE) (MIC)     RARE STAPHYLOCOCCUS SPECIES (COAGULASE NEGATIVE)                    Specimen Collected: 10/24/15 2:28 PM Last Resulted: 10/28/15 9:52 A          Exam  Gen: resting comfortably in bed, NAD   Abd: + BS, NT Pelvis: + suprapubic  swelling, + scrotal swelling and ecchymosis              Dressings changed             Some blistering where pt had steristrips             Incision lines are stable             No active drainage             Mild erythema in splotchy pattern around lower abdomen and Left flank, looks like irritation from dressing, steristrips and lying down in bed. Incision lines look good             No purulence             Motor and sensory functions at baseline             + DP pulses     Assessment and Plan    POD/HD#: 44  25 y/o male s/p LC 3 pelvic ring fx a little over 1 year ago with 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   -symptomatic hardware B SI screws/ L pubic rami nonunion following LC 3 pelvic ring fracture s/p ROH and repair of nonunion             Partial WB L leg (about 50 % BW)             No ROM restrictions             Ice PRN             PT/OT              Dressings changes as needed             clean wounds with soap and water              Ok for pt to shower                - Pain management:             Dc home with norco 10/325 1-2 po q6h prn severe pain             Ultram 50-100 mg po q6h prn pain (alternate with norco)             Robaxin [id number] mg po q6h prn               Continue lyrica 150 mg po q12h    - ABL anemia/Hemodynamics             H/H stable             bp's stable  HR much improved               - DVT/PE prophylaxis:             Lovenox as inpatient             Will dc home with 21 days of lovenox              - ID:              CoNS- sensitive to quinolones   Will change from doxycycline to levaquin 750 mg po daily x 6 weeks due to better bone penetration by quinolone class  - Metabolic Bone Disease:             Labs pending             25 OH vit D and calcitriol levels are ok             Nicotine pending   - Activity:             OOB with therapy               PWB L leg about 50 %               Walker or  crutches              - FEN/GI prophylaxis/Foley/Lines:             Diet as tolerated             voiding well    - misc             Toxicology screen + for cocaine             Serum confirmation ordered and is pending             Does not appear that current home meds would have caused false positive             Family/friends in room at time of eval so unable to speak with pt one-on-one               Will discuss once confirmatory test completed    - Dispo:            continue therapy             Dc home              Follow up in office in 10 days    Disposition: 01-Home or Self Care      Discharge Instructions    Call MD / Call 911    Complete by:  As directed   If you experience chest pain or shortness of breath, CALL 911 and be transported to the hospital emergency room.  If you develope a fever above 101 F, pus (white drainage) or increased drainage or redness at the wound, or calf pain, call your surgeon's office.     Constipation Prevention    Complete by:  As directed   Drink plenty of fluids.  Prune juice may be helpful.  You may use a stool softener, such as Colace (over the counter) 100 mg twice a day.  Use MiraLax (over the counter) for constipation as needed.     Diet general    Complete by:  As directed      Discharge instructions    Complete by:  As directed   Orthopaedic Trauma Service Discharge Instructions   General  Discharge Instructions  WEIGHT BEARING STATUS: partial weightbearing left leg (about 50 % bodyweight)  RANGE OF MOTION/ACTIVITY: range of motion as tolerated left leg  Wound Care: daily wound care as needed. Clean wounds with soap and water only. Do not apply lotions, ointments, solutions to wounds. Continue to cover if there is drainage otherwise can leave open to air. See more detailed instructions below   PAIN MEDICATION USE AND EXPECTATIONS  You have likely been given narcotic medications to help control your pain.  After a traumatic  event that results in an fracture (broken bone) with or without surgery, it is ok to use narcotic pain medications to help control one's pain.  We understand that everyone responds to pain differently and each individual patient will be evaluated on a regular basis for the continued need for narcotic medications. Ideally, narcotic medication use should last no more than 6-8 weeks (coinciding with fracture healing).   As a patient it is your responsibility as well to monitor narcotic medication use and report the amount and frequency you use these medications when you come to your office visit.   We would also advise that if you are using narcotic medications, you should take a dose prior to therapy to maximize you participation.  IF YOU ARE ON NARCOTIC MEDICATIONS IT IS NOT PERMISSIBLE TO OPERATE A MOTOR VEHICLE (MOTORCYCLE/CAR/TRUCK/MOPED) OR HEAVY MACHINERY DO NOT MIX NARCOTICS WITH OTHER CNS (CENTRAL NERVOUS SYSTEM) DEPRESSANTS SUCH AS ALCOHOL  Diet: as you were eating previously.  Can use over the counter stool softeners and bowel preparations, such as Miralax, to help with bowel movements.  Narcotics can be constipating.  Be sure to drink plenty of fluids    STOP SMOKING OR USING NICOTINE PRODUCTS!!!!  As discussed nicotine severely impairs your body's ability to heal surgical and traumatic wounds but also impairs bone healing.  Wounds and bone heal by forming microscopic blood vessels (angiogenesis) and nicotine is a vasoconstrictor (essentially, shrinks blood vessels).  Therefore, if vasoconstriction occurs to these microscopic blood vessels they essentially disappear and are unable to deliver necessary nutrients to the healing tissue.  This is one modifiable factor that you can do to dramatically increase your chances of healing your injury.    (This means no smoking, no nicotine gum, patches, etc)  DO NOT USE NONSTEROIDAL ANTI-INFLAMMATORY DRUGS (NSAID'S)  Using products such as Advil  (ibuprofen), Aleve (naproxen), Motrin (ibuprofen) for additional pain control during fracture healing can delay and/or prevent the healing response.  If you would like to take over the counter (OTC) medication, Tylenol (acetaminophen) is ok.  However, some narcotic medications that are given for pain control contain acetaminophen as well. Therefore, you should not exceed more than 4000 mg of tylenol in a day if you do not have liver disease.  Also note that there are may OTC medicines, such as cold medicines and allergy medicines that my contain tylenol as well.  If you have any questions about medications and/or interactions please ask your doctor/PA or your pharmacist.      ICE AND ELEVATE INJURED/OPERATIVE EXTREMITY  Using ice and elevating the injured extremity above your heart can help with swelling and pain control.  Icing in a pulsatile fashion, such as 20 minutes on and 20 minutes off, can be followed.    Do not place ice directly on skin. Make sure there is a barrier between to skin and the ice pack.    Using frozen items such as frozen peas works well as  the conform nicely to the are that needs to be iced.  USE AN ACE WRAP OR TED HOSE FOR SWELLING CONTROL  In addition to icing and elevation, Ace wraps or TED hose are used to help limit and resolve swelling.  It is recommended to use Ace wraps or TED hose until you are informed to stop.    When using Ace Wraps start the wrapping distally (farthest away from the body) and wrap proximally (closer to the body)   Example: If you had surgery on your leg or thing and you do not have a splint on, start the ace wrap at the toes and work your way up to the thigh        If you had surgery on your upper extremity and do not have a splint on, start the ace wrap at your fingers and work your way up to the upper arm  IF YOU ARE IN A SPLINT OR CAST DO NOT REMOVE IT FOR ANY REASON   If your splint gets wet for any reason please contact the office  immediately. You may shower in your splint or cast as long as you keep it dry.  This can be done by wrapping in a cast cover or garbage back (or similar)  Do Not stick any thing down your splint or cast such as pencils, money, or hangers to try and scratch yourself with.  If you feel itchy take benadryl as prescribed on the bottle for itching  IF YOU ARE IN A CAM BOOT (BLACK BOOT)  You may remove boot periodically. Perform daily dressing changes as noted below.  Wash the liner of the boot regularly and wear a sock when wearing the boot. It is recommended that you sleep in the boot until told otherwise  CALL THE OFFICE WITH ANY QUESTIONS OR CONCERNS: [phone]    Discharge Wound Care Instructions  Do NOT apply any ointments, solutions or lotions to pin sites or surgical wounds.  These prevent needed drainage and even though solutions like hydrogen peroxide kill bacteria, they also damage cells lining the pin sites that help fight infection.  Applying lotions or ointments can keep the wounds moist and can cause them to breakdown and open up as well. This can increase the risk for infection. When in doubt call the office.  Surgical incisions should be dressed daily.  If any drainage is noted, use one layer of adaptic, then gauze, Kerlix, and an ace wrap.  Once the incision is completely dry and without drainage, it may be left open to air out.  Showering may begin 36-48 hours later.  Cleaning gently with soap and water.  Traumatic wounds should be dressed daily as well.    One layer of adaptic, gauze, Kerlix, then ace wrap.  The adaptic can be discontinued once the draining has ceased    If you have a wet to dry dressing: wet the gauze with saline the squeeze as much saline out so the gauze is moist (not soaking wet), place moistened gauze over wound, then place a dry gauze over the moist one, followed by Kerlix wrap, then ace wrap.     Increase activity slowly as tolerated    Complete by:   As directed      Partial weight bearing    Complete by:  As directed   % Body Weight:  50  Laterality:  left  Extremity:  Lower            Medication List  TAKE these medications        docusate sodium 100 MG capsule  Commonly known as:  COLACE  Take 1 capsule (100 mg total) by mouth 2 (two) times daily.     enoxaparin 40 MG/0.4ML injection  Commonly known as:  LOVENOX  Inject 0.4 mLs (40 mg total) into the skin daily.     HYDROcodone-acetaminophen 10-325 MG tablet  Commonly known as:  NORCO  Take 1-2 tablets by mouth every 6 (six) hours as needed for severe pain.     levofloxacin 750 MG tablet  Commonly known as:  LEVAQUIN  Take 1 tablet (750 mg total) by mouth daily.     LYRICA 150 MG capsule  Generic drug:  pregabalin  Take 1 capsule by mouth 2 (two) times daily.     methocarbamol 500 MG tablet  Commonly known as:  ROBAXIN  Take 2 tablets (1,000 mg total) by mouth 4 (four) times daily.     traMADol 50 MG tablet  Commonly known as:  ULTRAM  Take 1-2 tablets (50-100 mg total) by mouth every 6 (six) hours as needed for moderate pain or severe pain.       Follow-up Information    Follow up with Rozanna Box, MD.   Specialty:  Orthopedic Surgery   Why:  11-17-15 at 1100 arranged by [facility] [employer]   Contact information:   [address] [place] [address] [phone]       Discharge Instructions and Plan: 25 y/o male s/p LC 3 pelvic ring fx a little over 1 year ago with 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   -symptomatic hardware B SI screws/ L pubic rami nonunion following LC 3 pelvic ring fracture s/p ROH and repair of nonunion             Partial WB L leg (about 50 % BW)             No ROM restrictions             Ice PRN             PT/OT              Dressings changes as needed             clean wounds with soap and water              Ok for pt to shower                - Pain management:             Dc home  with norco 10/325 1-2 po q6h prn severe pain             Ultram 50-100 mg po q6h prn pain (alternate with norco)             Robaxin [phone] mg po q6h prn               Continue lyrica 150 mg po q12h    - ABL anemia/Hemodynamics             H/H stable             bp's stable               HR much improved               - DVT/PE prophylaxis:  Lovenox as inpatient             Will dc home with 21 days of lovenox              - ID:              CoNS-  sensitive to quinolones   Will change from doxycycline to levaquin 750 mg po daily x 6 weeks due to better bone penetration by quinolone class   - Metabolic Bone Disease:             Labs pending             25 OH vit D and calcitriol levels are ok             Nicotine pending   - Activity:             OOB with therapy               PWB L leg about 50 %               Walker or crutches              - FEN/GI prophylaxis/Foley/Lines:             Diet as tolerated             voiding well    - misc             Toxicology screen + for cocaine             Serum confirmation ordered and is pending             Does not appear that current home meds would have caused false positive             Family/friends in room at time of eval so unable to speak with pt one-on-one               Will discuss once confirmatory test completed    - Dispo:            continue therapy             Dc home              Follow up in office in 10 days   Signed:  Jari Pigg, PA-C [facility] [phone] (P) 10/28/2015, 10:04 AM

## 2015-10-27 NOTE — Progress Notes (Signed)
[facility] Progress Note  Subjective  No acute changes Pain tolerable with po meds Voiding w/o difficulty Showered yesterday Ambulated about 200 ft yesterday as well No CP or SOB No N/V No palpitations No dyspnea No H/A or blurred vision   ROS As above   Objective   BP 122/66 mmHg  Pulse 128  Temp(Src) 99.4 F (37.4 C) (Axillary)  Resp 18  Ht '5\' 10"'$  (1.778 m)  Wt 88.451 kg (195 lb)  BMI 27.98 kg/m2  SpO2 94%  Intake/Output      06/29 0701 - 06/30 0700 06/30 0701 - 07/01 0700   P.O. 340    I.V. (mL/kg) 2360.8 (26.7)    IV Piggyback     Total Intake(mL/kg) 2700.8 (30.5)    Urine (mL/kg/hr)     Total Output       Net +2700.8          Urine Occurrence 8 x      Labs   Results for CLIVE, PARCEL (MRN [id number]) as of 10/27/2015 08:44  Ref. Range 10/26/2015 11:28  Hep A Ab, IgM Latest Ref Range: Negative  Negative  Hepatitis B Surface Ag Latest Ref Range: Negative  Negative  Hep B Core Ab, IgM Latest Ref Range: Negative  Negative  HCV Ab Latest Ref Range: 0.0-0.9 s/co ratio <0.1  HIV Latest Ref Range: Non Reactive  Non Reactive   Results for KEISON, GLENDINNING (MRN [id number]) as of 10/27/2015 08:44  Ref. Range 10/27/2015 06:02  Sodium Latest Ref Range: 135-145 mmol/L 133 (L)  Potassium Latest Ref Range: 3.5-5.1 mmol/L 3.5  Chloride Latest Ref Range: 101-111 mmol/L 98 (L)  CO2 Latest Ref Range: 22-32 mmol/L 26  BUN Latest Ref Range: 6-20 mg/dL <5 (L)  Creatinine Latest Ref Range: 0.61-1.24 mg/dL 0.90  Calcium Latest Ref Range: 8.9-10.3 mg/dL 8.9  EGFR (Non-African Amer.) Latest Ref Range: >60 mL/min >60  EGFR (African American) Latest Ref Range: >60 mL/min >60  Glucose Latest Ref Range: 65-99 mg/dL 115 (H)  Anion gap Latest Ref Range: 5-15  9  Results for RYKER, SUDBURY (MRN [id number]) as of 10/27/2015 08:44  Ref. Range 10/26/2015 11:28  WBC Latest Ref Range: 4.0-10.5 K/uL 15.3 (H)  RBC Latest Ref Range: 4.22-5.81 MIL/uL 4.49   Hemoglobin Latest Ref Range: 13.0-17.0 g/dL 14.0  HCT Latest Ref Range: 39.0-52.0 % 40.1  MCV Latest Ref Range: 78.0-100.0 fL 89.3  MCH Latest Ref Range: 26.0-34.0 pg 31.2  MCHC Latest Ref Range: 30.0-36.0 g/dL 34.9  RDW Latest Ref Range: 11.5-15.5 % 12.0  Platelets Latest Ref Range: 150-400 K/uL 183   Exam  Gen: awake and alert, NAD, lying in bed Lungs: clear anterior fields Cardiac: tachy but regular   Abd: + BS, NT Pelvis: + suprapubic swelling, + scrotal swelling               Dressings changed             Incision lines are stable             No active drainage             Mild erythema in splotchy pattern around lower abdomen and Left flank, looks like irritation from dressing, steristrips and lying down in bed. 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 s/p ROH and repair of nonunion             Partial WB L leg (about 50 % BW)             No ROM restrictions             Ice PRN             PT/OT              Dressings changes as needed             clean wounds with soap and water              Ok for pt to shower                - Pain management:             Dc home with norco 10/325 1-2 po q6h prn severe pain  Ultram 50-100 mg po q6h prn pain (alternate with norco)  Robaxin [phone] mg po q6h prn   Continue lyrica 150 mg po q12h    - ABL anemia/Hemodynamics             H/H stable  bp's stable               - DVT/PE prophylaxis:             Lovenox as inpatient             Will dc home with 21 days of lovenox              - ID:              L flank cellulitis vs soft tissue irritation- 14 day course of doxycycline 100 mg po q12h     Pt afebrile   - Metabolic Bone Disease:             Labs pending             25 OH vit D and calcitriol levels are ok             Nicotine pending   - Activity:             OOB with therapy               PWB L leg about 50 %               Walker or crutches              - FEN/GI prophylaxis/Foley/Lines:             Diet as tolerated             voiding well    - misc             Toxicology screen + for cocaine             Serum confirmation ordered and is pending             Does not appear that current home meds would have caused false positive             Family/friends  in room at time of eval so unable to speak with pt one-on-one               Will discuss once confirmatory test completed    - Dispo:            continue therapy             Dc home   Follow up in office in 10 days  Jari Pigg, PA-C [facility] [phone] (O) 10/27/2015 8:43 AM

## 2015-10-27 NOTE — Discharge Instructions (Signed)
[facility]uma Service Discharge Instructions   General Discharge Instructions  WEIGHT BEARING STATUS: partial weightbearing left leg (about 50 % bodyweight)  RANGE OF MOTION/ACTIVITY: range of motion as tolerated left leg  Wound Care: daily wound care as needed. Clean wounds with soap and water only. Do not apply lotions, ointments, solutions to wounds. Continue to cover if there is drainage otherwise can leave open to air. See more detailed instructions below   PAIN MEDICATION USE AND EXPECTATIONS  You have likely been given narcotic medications to help control your pain.  After a traumatic event that results in an fracture (broken bone) with or without surgery, it is ok to use narcotic pain medications to help control one's pain.  We understand that everyone responds to pain differently and each individual patient will be evaluated on a regular basis for the continued need for narcotic medications. Ideally, narcotic medication use should last no more than 6-8 weeks (coinciding with fracture healing).   As a patient it is your responsibility as well to monitor narcotic medication use and report the amount and frequency you use these medications when you come to your office visit.   We would also advise that if you are using narcotic medications, you should take a dose prior to therapy to maximize you participation.  IF YOU ARE ON NARCOTIC MEDICATIONS IT IS NOT PERMISSIBLE TO OPERATE A MOTOR VEHICLE (MOTORCYCLE/CAR/TRUCK/MOPED) OR HEAVY MACHINERY DO NOT MIX NARCOTICS WITH OTHER CNS (CENTRAL NERVOUS SYSTEM) DEPRESSANTS SUCH AS ALCOHOL  Diet: as you were eating previously.  Can use over the counter stool softeners and bowel preparations, such as Miralax, to help with bowel movements.  Narcotics can be constipating.  Be sure to drink plenty of fluids    STOP SMOKING OR USING NICOTINE PRODUCTS!!!!  As discussed nicotine severely impairs your body's ability to heal surgical and traumatic  wounds but also impairs bone healing.  Wounds and bone heal by forming microscopic blood vessels (angiogenesis) and nicotine is a vasoconstrictor (essentially, shrinks blood vessels).  Therefore, if vasoconstriction occurs to these microscopic blood vessels they essentially disappear and are unable to deliver necessary nutrients to the healing tissue.  This is one modifiable factor that you can do to dramatically increase your chances of healing your injury.    (This means no smoking, no nicotine gum, patches, etc)  DO NOT USE NONSTEROIDAL ANTI-INFLAMMATORY DRUGS (NSAID'S)  Using products such as Advil (ibuprofen), Aleve (naproxen), Motrin (ibuprofen) for additional pain control during fracture healing can delay and/or prevent the healing response.  If you would like to take over the counter (OTC) medication, Tylenol (acetaminophen) is ok.  However, some narcotic medications that are given for pain control contain acetaminophen as well. Therefore, you should not exceed more than 4000 mg of tylenol in a day if you do not have liver disease.  Also note that there are may OTC medicines, such as cold medicines and allergy medicines that my contain tylenol as well.  If you have any questions about medications and/or interactions please ask your doctor/PA or your pharmacist.      ICE AND ELEVATE INJURED/OPERATIVE EXTREMITY  Using ice and elevating the injured extremity above your heart can help with swelling and pain control.  Icing in a pulsatile fashion, such as 20 minutes on and 20 minutes off, can be followed.    Do not place ice directly on skin. Make sure there is a barrier between to skin and the ice pack.    Using frozen items  such as frozen peas works well as the conform nicely to the are that needs to be iced.  USE AN ACE WRAP OR TED HOSE FOR SWELLING CONTROL  In addition to icing and elevation, Ace wraps or TED hose are used to help limit and resolve swelling.  It is recommended to use Ace wraps or  TED hose until you are informed to stop.    When using Ace Wraps start the wrapping distally (farthest away from the body) and wrap proximally (closer to the body)   Example: If you had surgery on your leg or thing and you do not have a splint on, start the ace wrap at the toes and work your way up to the thigh        If you had surgery on your upper extremity and do not have a splint on, start the ace wrap at your fingers and work your way up to the upper arm  IF YOU ARE IN A SPLINT OR CAST DO NOT REMOVE IT FOR ANY REASON   If your splint gets wet for any reason please contact the office immediately. You may shower in your splint or cast as long as you keep it dry.  This can be done by wrapping in a cast cover or garbage back (or similar)  Do Not stick any thing down your splint or cast such as pencils, money, or hangers to try and scratch yourself with.  If you feel itchy take benadryl as prescribed on the bottle for itching  IF YOU ARE IN A CAM BOOT (BLACK BOOT)  You may remove boot periodically. Perform daily dressing changes as noted below.  Wash the liner of the boot regularly and wear a sock when wearing the boot. It is recommended that you sleep in the boot until told otherwise  CALL THE OFFICE WITH ANY QUESTIONS OR CONCERNS: [phone]    Discharge Wound Care Instructions  Do NOT apply any ointments, solutions or lotions to pin sites or surgical wounds.  These prevent needed drainage and even though solutions like hydrogen peroxide kill bacteria, they also damage cells lining the pin sites that help fight infection.  Applying lotions or ointments can keep the wounds moist and can cause them to breakdown and open up as well. This can increase the risk for infection. When in doubt call the office.  Surgical incisions should be dressed daily.  If any drainage is noted, use one layer of adaptic, then gauze, Kerlix, and an ace wrap.  Once the incision is completely dry and without  drainage, it may be left open to air out.  Showering may begin 36-48 hours later.  Cleaning gently with soap and water.  Traumatic wounds should be dressed daily as well.    One layer of adaptic, gauze, Kerlix, then ace wrap.  The adaptic can be discontinued once the draining has ceased    If you have a wet to dry dressing: wet the gauze with saline the squeeze as much saline out so the gauze is moist (not soaking wet), place moistened gauze over wound, then place a dry gauze over the moist one, followed by Kerlix wrap, then ace wrap.

## 2015-10-28 ENCOUNTER — Encounter (HOSPITAL_COMMUNITY): Payer: Self-pay | Admitting: Orthopedic Surgery

## 2015-10-28 DIAGNOSIS — M869 Osteomyelitis, unspecified: Secondary | ICD-10-CM | POA: Diagnosis present

## 2015-10-28 HISTORY — DX: Osteomyelitis, unspecified: M86.9

## 2015-10-28 LAB — NICOTINE/COTININE METABOLITES
Cotinine: 39.8 ng/mL
NICOTINE: NOT DETECTED ng/mL

## 2015-10-28 MED ORDER — LEVOFLOXACIN 750 MG PO TABS: 750.0000 mg | ORAL_TABLET | Freq: Every day | ORAL | Status: DC

## 2015-10-28 NOTE — Progress Notes (Signed)
[facility]opaedic Trauma Service Progress Note  Subjective  Feeling better today Ready to go  Pain decreased Worked well with PT yesterday   [facility]   Voiding well + flatus   Review of Systems  Constitutional: Negative for fever and chills.  Eyes: Negative for double vision.  Respiratory: Negative for shortness of breath.   Cardiovascular: Negative for chest pain and palpitations.  Gastrointestinal: Negative for nausea and vomiting.     Objective   BP 140/79 mmHg  Pulse 101  Temp(Src) 98.6 F (37 C) (Oral)  Resp 18  Ht 5\' 10"  (1.778 m)  Wt 88.451 kg (195 lb)  BMI 27.98 kg/m2  SpO2 98%  Intake/Output      06/30 0701 - 07/01 0700 07/01 0701 - 07/02 0700   P.O. 700    I.V. (mL/kg)     Total Intake(mL/kg) 700 (7.9)    Net +700          Urine Occurrence 9 x      Labs  Aerobic/Anaerobic Culture (surgical/deep wound)  Status: Preliminary result     Visible to patient:  Not Released     Next appt: None              4d ago     Specimen Description WOUND    Special Requests PELVIC NON UNION SITE SWABS    Gram Stain NO WBC SEEN  NO ORGANISMS SEEN  RESULT CALLED TO, READ BACK BY AND VERIFIED WITH: L CAMPBELL 10/24/15 @ 1614 M VESTAL        Culture RARE STAPHYLOCOCCUS SPECIES (COAGULASE NEGATIVE)  NO ANAEROBES ISOLATED; CULTURE IN PROGRESS FOR 5 DAYS        Report Status PENDING    Organism ID, Bacteria STAPHYLOCOCCUS SPECIES (COAGULASE NEGATIVE)    Resulting Agency [facility]      Culture & Susceptibility         STAPHYLOCOCCUS SPECIES (COAGULASE NEGATIVE)       Antibiotic Sensitivity Microscan Status      CIPROFLOXACIN Sensitive <=0.5 SENSITIVE Final      Method: MIC      CLINDAMYCIN Resistant >=8 RESISTANT Final      Method: MIC      ERYTHROMYCIN Resistant >=8 RESISTANT Final      Method: MIC      GENTAMICIN Sensitive <=0.5 SENSITIVE Final      Method: MIC      Inducible Clindamycin Sensitive NEGATIVE Final     Method: MIC      OXACILLIN Resistant >=4 RESISTANT Final      Method: MIC      RIFAMPIN Sensitive <=0.5 SENSITIVE Final      Method: MIC      TETRACYCLINE Sensitive 2 SENSITIVE Final      Method: MIC      TRIMETH/SULFA Sensitive <=10 SENSITIVE Final      Method: MIC      VANCOMYCIN Sensitive 2 SENSITIVE Final      Method: MIC      Comments STAPHYLOCOCCUS SPECIES (COAGULASE NEGATIVE) (MIC)     RARE STAPHYLOCOCCUS SPECIES (COAGULASE NEGATIVE)               Exam  Gen: resting comfortably in bed, NAD  Abd: + BS, NT Pelvis: + suprapubic swelling, + scrotal swelling and ecchymosis              Dressings changed  Some blistering where pt had steristrips  Incision lines are stable             No active drainage             Mild erythema in splotchy pattern around lower abdomen and Left flank, looks like irritation from dressing, steristrips and lying down in bed. 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 LC 3 pelvic ring fracture s/p ROH and repair of nonunion             Partial WB L leg (about 50 % BW)             No ROM restrictions             Ice PRN             PT/OT              Dressings changes as needed             clean wounds with soap and water              Ok for pt to shower                - Pain management:             Dc home with norco 10/325 1-2 po q6h prn severe pain             Ultram 50-100 mg po q6h prn pain (alternate with norco)             Robaxin [phone] mg po q6h prn               Continue lyrica 150 mg po q12h    - ABL anemia/Hemodynamics             H/H stable             bp's stable   HR much improved               -  DVT/PE prophylaxis:             Lovenox as inpatient             Will dc home with 21 days of lovenox              - ID:              CoNS- sensitive to quinolones   Will change from doxycycline to levaquin 750 mg po daily x 6 weeks due to better bone penetration with quinolone class   - Metabolic Bone Disease:             Labs pending             25 OH vit D and calcitriol levels are ok             Nicotine pending   - Activity:             OOB with therapy  PWB L leg about 50 %               Walker or crutches              - FEN/GI prophylaxis/Foley/Lines:             Diet as tolerated             voiding well    - misc             Toxicology screen + for cocaine             Serum confirmation ordered and is pending             Does not appear that current home meds would have caused false positive             Family/friends in room at time of eval so unable to speak with pt one-on-one               Will discuss once confirmatory test completed    - Dispo:            continue therapy             Dc home              Follow up in office in 10 days    Mearl LatinKeith W. Baily Serpe, PA-C [facility] [phone] (O) 10/28/2015 9:50 AM

## 2015-10-29 LAB — AEROBIC/ANAEROBIC CULTURE W GRAM STAIN (SURGICAL/DEEP WOUND): Gram Stain: NONE SEEN

## 2015-10-29 LAB — AEROBIC/ANAEROBIC CULTURE (SURGICAL/DEEP WOUND)
CULTURE: NO GROWTH
GRAM STAIN: NONE SEEN

## 2015-11-01 LAB — COCAINE,MS,WB/SP RFX
Benzoylecgonine: 55 ng/mL
COCAINE CONFIRMATION: POSITIVE
Cocaine: NEGATIVE ng/mL

## 2015-11-01 NOTE — Care Management (Signed)
Discharge summary faxed to [employer] [phone] .   Ronny FlurryHeather Makinze Jani RN BSN [phone]

## 2015-11-06 LAB — DRUG SCREEN 10 W/CONF, SERUM
Amphetamines, IA: NEGATIVE ng/mL
BENZODIAZEPINES, IA: NEGATIVE ng/mL
Barbiturates, IA: NEGATIVE ug/mL
COCAINE & METABOLITE, IA: POSITIVE ng/mL
Methadone, IA: NEGATIVE ng/mL
OXYCODONES, IA: NEGATIVE ng/mL
Opiates, IA: POSITIVE ng/mL
PHENCYCLIDINE, IA: NEGATIVE ng/mL
PROPOXYPHENE, IA: NEGATIVE ng/mL
THC(Marijuana) Metabolite, IA: NEGATIVE ng/mL

## 2015-11-06 LAB — OXYCODONES,MS,WB/SP RFX
OXYCOCONE: NEGATIVE ng/mL
OXYMORPHONE: NEGATIVE ng/mL
Oxycodones Confirmation: NEGATIVE

## 2015-11-06 LAB — OPIATES,MS,WB/SP RFX
6-Acetylmorphine: NEGATIVE
CODEINE: NEGATIVE ng/mL
Dihydrocodeine: 1.1 ng/mL
HYDROCODONE: 49 ng/mL
Hydromorphone: NEGATIVE ng/mL
Morphine: NEGATIVE ng/mL
OPIATE CONFIRMATION: POSITIVE

## 2018-02-15 IMAGING — RF DG PELVIS 1-2V
1 series · 2 of 2 positions shown · non-contrast
Comparison: 08/06/2015

CLINICAL DATA: Screw removal and graft insertion pubic fracture

EXAM:
DG C-ARM 61-120 MIN; PELVIS - 1-2 VIEW

[Series 1: run · 2 of 2 slices shown]
[im 1/2]
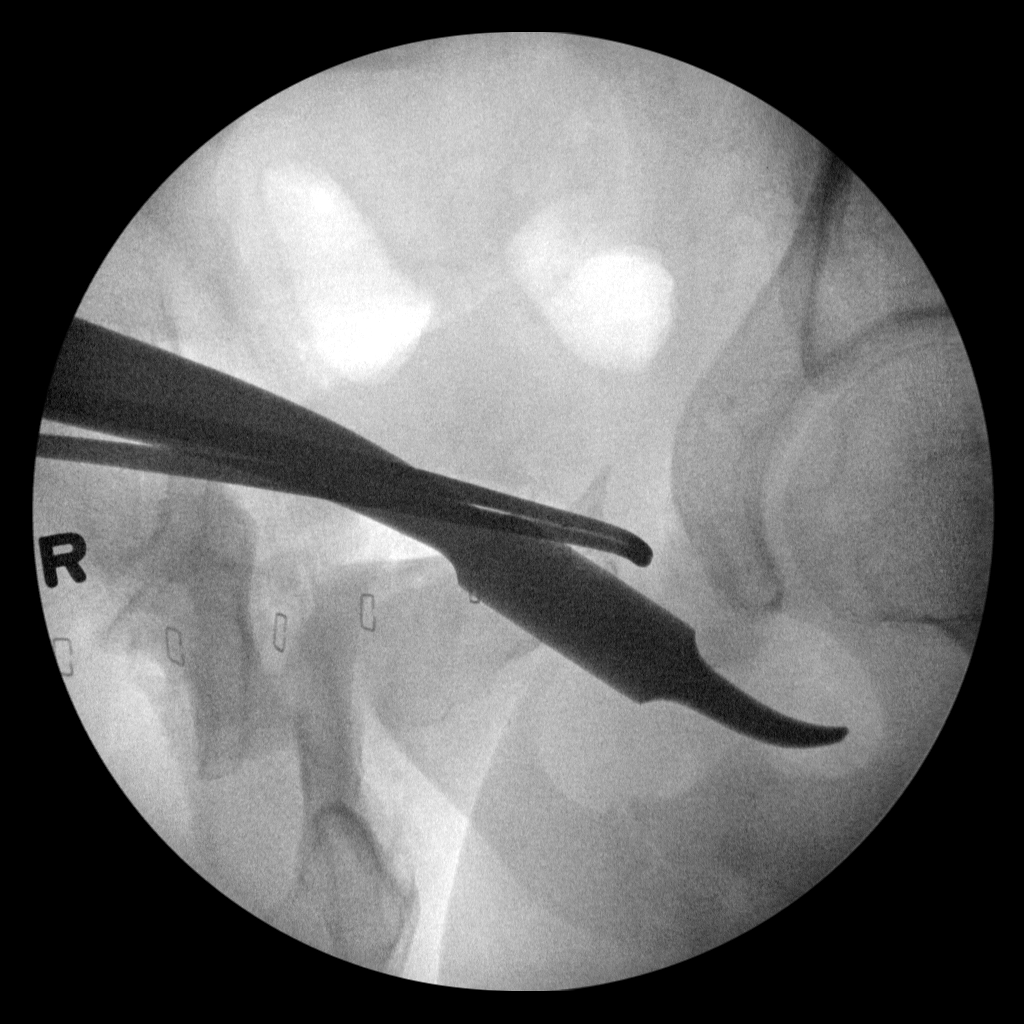
[im 2/2]
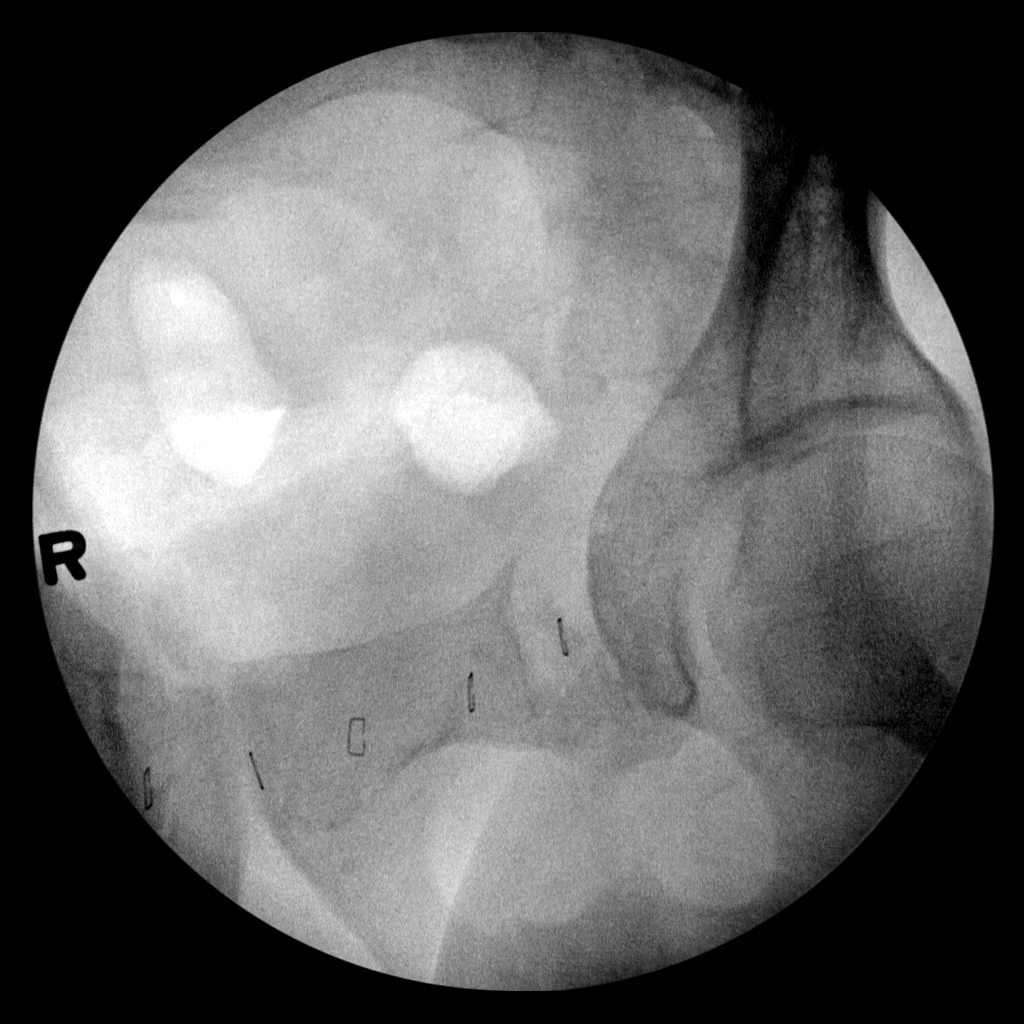

[2 of 2 positions shown; findings below may reference images not displayed]

FINDINGS: Skin staples are noted within lower pelvis. Again noted fractures
bilateral pubic rami. There is nonunion of left superior pubic ramus
fracture. Probable postop grafting at this level. Please see the
operative report.
IMPRESSION: Skin staple are noted within lower pelvis. Again noted fractures
bilateral pubic rami. There is nonunion of left superior pubic ramus
fracture. Probable postop grafting at this level. Please see the
operative report.

Please see the operative report.

## 2018-02-20 ENCOUNTER — Inpatient Hospital Stay
Admission: EM | Admit: 2018-02-20 | Discharge: 2018-02-22 | DRG: 917 | Disposition: A | Discharge status: Home / Self Care | Payer: Self-pay | Attending: Internal Medicine | Admitting: Internal Medicine

## 2018-02-20 ENCOUNTER — Emergency Department: Payer: Self-pay

## 2018-02-20 ENCOUNTER — Emergency Department
Admission: EM | Admit: 2018-02-20 | Discharge: 2018-02-20 | Disposition: A | Discharge status: Still In Hospital | Payer: Self-pay

## 2018-02-20 DIAGNOSIS — R159 Full incontinence of feces: Secondary | ICD-10-CM | POA: Diagnosis present

## 2018-02-20 DIAGNOSIS — K219 Gastro-esophageal reflux disease without esophagitis: Secondary | ICD-10-CM | POA: Diagnosis present

## 2018-02-20 DIAGNOSIS — R7401 Elevation of levels of liver transaminase levels: Secondary | ICD-10-CM

## 2018-02-20 DIAGNOSIS — R230 Cyanosis: Secondary | ICD-10-CM | POA: Diagnosis present

## 2018-02-20 DIAGNOSIS — G8929 Other chronic pain: Secondary | ICD-10-CM | POA: Diagnosis present

## 2018-02-20 DIAGNOSIS — F1910 Other psychoactive substance abuse, uncomplicated: Secondary | ICD-10-CM | POA: Diagnosis present

## 2018-02-20 DIAGNOSIS — B9629 Other Escherichia coli [E. coli] as the cause of diseases classified elsewhere: Secondary | ICD-10-CM | POA: Diagnosis present

## 2018-02-20 DIAGNOSIS — F4325 Adjustment disorder with mixed disturbance of emotions and conduct: Secondary | ICD-10-CM | POA: Diagnosis present

## 2018-02-20 DIAGNOSIS — F1410 Cocaine abuse, uncomplicated: Secondary | ICD-10-CM | POA: Diagnosis present

## 2018-02-20 DIAGNOSIS — T40601A Poisoning by unspecified narcotics, accidental (unintentional), initial encounter: Principal | ICD-10-CM | POA: Diagnosis present

## 2018-02-20 DIAGNOSIS — E778 Other disorders of glycoprotein metabolism: Secondary | ICD-10-CM | POA: Diagnosis present

## 2018-02-20 DIAGNOSIS — F1110 Opioid abuse, uncomplicated: Secondary | ICD-10-CM

## 2018-02-20 DIAGNOSIS — G629 Polyneuropathy, unspecified: Secondary | ICD-10-CM | POA: Diagnosis present

## 2018-02-20 DIAGNOSIS — Z881 Allergy status to other antibiotic agents status: Secondary | ICD-10-CM

## 2018-02-20 DIAGNOSIS — R740 Nonspecific elevation of levels of transaminase and lactic acid dehydrogenase [LDH]: Secondary | ICD-10-CM

## 2018-02-20 DIAGNOSIS — R739 Hyperglycemia, unspecified: Secondary | ICD-10-CM | POA: Diagnosis present

## 2018-02-20 DIAGNOSIS — R32 Unspecified urinary incontinence: Secondary | ICD-10-CM | POA: Diagnosis present

## 2018-02-20 DIAGNOSIS — J9601 Acute respiratory failure with hypoxia: Secondary | ICD-10-CM | POA: Diagnosis present

## 2018-02-20 DIAGNOSIS — Z9049 Acquired absence of other specified parts of digestive tract: Secondary | ICD-10-CM

## 2018-02-20 DIAGNOSIS — R197 Diarrhea, unspecified: Secondary | ICD-10-CM | POA: Diagnosis present

## 2018-02-20 DIAGNOSIS — D72829 Elevated white blood cell count, unspecified: Secondary | ICD-10-CM | POA: Diagnosis present

## 2018-02-20 DIAGNOSIS — R945 Abnormal results of liver function studies: Secondary | ICD-10-CM | POA: Diagnosis present

## 2018-02-20 DIAGNOSIS — F4310 Post-traumatic stress disorder, unspecified: Secondary | ICD-10-CM | POA: Diagnosis present

## 2018-02-20 DIAGNOSIS — T40604A Poisoning by unspecified narcotics, undetermined, initial encounter: Secondary | ICD-10-CM

## 2018-02-20 DIAGNOSIS — F419 Anxiety disorder, unspecified: Secondary | ICD-10-CM | POA: Diagnosis present

## 2018-02-20 DIAGNOSIS — N179 Acute kidney failure, unspecified: Secondary | ICD-10-CM | POA: Diagnosis present

## 2018-02-20 DIAGNOSIS — G2581 Restless legs syndrome: Secondary | ICD-10-CM | POA: Diagnosis present

## 2018-02-20 DIAGNOSIS — F1310 Sedative, hypnotic or anxiolytic abuse, uncomplicated: Secondary | ICD-10-CM

## 2018-02-20 LAB — URINALYSIS, COMPLETE (UACMP) WITH MICROSCOPIC
Bilirubin Urine: NEGATIVE
GLUCOSE, UA: NEGATIVE mg/dL
Hgb urine dipstick: NEGATIVE
KETONES UR: NEGATIVE mg/dL
LEUKOCYTES UA: NEGATIVE
NITRITE: NEGATIVE
PROTEIN: NEGATIVE mg/dL
Specific Gravity, Urine: 1.011 (ref 1.005–1.030)
pH: 5 (ref 5.0–8.0)

## 2018-02-20 LAB — CBC WITH DIFFERENTIAL/PLATELET
Abs Immature Granulocytes: 0.66 10*3/uL — ABNORMAL HIGH (ref 0.00–0.07)
BASOS PCT: 1 %
Basophils Absolute: 0.1 10*3/uL (ref 0.0–0.1)
EOS ABS: 0.1 10*3/uL (ref 0.0–0.5)
Eosinophils Relative: 1 %
HCT: 50.4 % (ref 39.0–52.0)
Hemoglobin: 16.8 g/dL (ref 13.0–17.0)
Immature Granulocytes: 3 %
Lymphocytes Relative: 5 %
Lymphs Abs: 1.1 10*3/uL (ref 0.7–4.0)
MCH: 30.8 pg (ref 26.0–34.0)
MCHC: 33.3 g/dL (ref 30.0–36.0)
MCV: 92.3 fL (ref 80.0–100.0)
MONO ABS: 1.4 10*3/uL — AB (ref 0.1–1.0)
MONOS PCT: 7 %
Neutro Abs: 17.7 10*3/uL — ABNORMAL HIGH (ref 1.7–7.7)
Neutrophils Relative %: 83 %
PLATELETS: 400 10*3/uL (ref 150–400)
RBC: 5.46 MIL/uL (ref 4.22–5.81)
RDW: 12 % (ref 11.5–15.5)
WBC: 21 10*3/uL — AB (ref 4.0–10.5)
nRBC: 0 % (ref 0.0–0.2)

## 2018-02-20 LAB — URINE DRUG SCREEN, QUALITATIVE (ARMC ONLY)
Amphetamines, Ur Screen: NOT DETECTED
BARBITURATES, UR SCREEN: NOT DETECTED
BENZODIAZEPINE, UR SCRN: POSITIVE — AB
CANNABINOID 50 NG, UR ~~LOC~~: POSITIVE — AB
Cocaine Metabolite,Ur ~~LOC~~: POSITIVE — AB
MDMA (Ecstasy)Ur Screen: NOT DETECTED
Methadone Scn, Ur: NOT DETECTED
Opiate, Ur Screen: POSITIVE — AB
PHENCYCLIDINE (PCP) UR S: NOT DETECTED
TRICYCLIC, UR SCREEN: NOT DETECTED

## 2018-02-20 LAB — COMPREHENSIVE METABOLIC PANEL
ALT: 264 U/L — AB (ref 0–44)
ALT: 242 U/L — ABNORMAL HIGH (ref 0–44)
ANION GAP: 16 — AB (ref 5–15)
ANION GAP: 11 (ref 5–15)
AST: 127 U/L — ABNORMAL HIGH (ref 15–41)
AST: 113 U/L — ABNORMAL HIGH (ref 15–41)
Albumin: 4.4 g/dL (ref 3.5–5.0)
Albumin: 4.4 g/dL (ref 3.5–5.0)
Alkaline Phosphatase: 135 U/L — ABNORMAL HIGH (ref 38–126)
Alkaline Phosphatase: 114 U/L (ref 38–126)
BUN: 15 mg/dL (ref 6–20)
BUN: 16 mg/dL (ref 6–20)
CHLORIDE: 96 mmol/L — AB (ref 98–111)
CHLORIDE: 102 mmol/L (ref 98–111)
CO2: 29 mmol/L (ref 22–32)
CO2: 25 mmol/L (ref 22–32)
CREATININE: 1.58 mg/dL — AB (ref 0.61–1.24)
CREATININE: 1.31 mg/dL — AB (ref 0.61–1.24)
Calcium: 9.1 mg/dL (ref 8.9–10.3)
Calcium: 8.4 mg/dL — ABNORMAL LOW (ref 8.9–10.3)
GFR, EST NON AFRICAN AMERICAN: 58 mL/min — AB (ref >60)
Glucose, Bld: 150 mg/dL — ABNORMAL HIGH (ref 70–99)
Glucose, Bld: 122 mg/dL — ABNORMAL HIGH (ref 70–99)
POTASSIUM: 5.4 mmol/L — AB (ref 3.5–5.1)
Potassium: 4.8 mmol/L (ref 3.5–5.1)
SODIUM: 138 mmol/L (ref 135–145)
Sodium: 141 mmol/L (ref 135–145)
Total Bilirubin: 0.7 mg/dL (ref 0.3–1.2)
Total Bilirubin: 1.1 mg/dL (ref 0.3–1.2)
Total Protein: 8.4 g/dL — ABNORMAL HIGH (ref 6.5–8.1)
Total Protein: 7.6 g/dL (ref 6.5–8.1)

## 2018-02-20 LAB — SALICYLATE LEVEL: Salicylate Lvl: <7 mg/dL (ref 2.8–30.0)

## 2018-02-20 LAB — GLUCOSE, CAPILLARY: GLUCOSE-CAPILLARY: 121 mg/dL — AB (ref 70–99)

## 2018-02-20 LAB — ACETAMINOPHEN LEVEL: Acetaminophen (Tylenol), Serum: <10 ug/mL — ABNORMAL LOW (ref 10–30)

## 2018-02-20 LAB — ETHANOL

## 2018-02-20 MED ORDER — METHYLPREDNISOLONE SODIUM SUCC 125 MG IJ SOLR
125.0000 mg | Freq: Once | INTRAMUSCULAR | Status: AC
  Administered 2018-02-20: 125 mg via INTRAVENOUS
  Filled 2018-02-20: qty 2

## 2018-02-20 MED ORDER — IBUPROFEN 600 MG PO TABS
ORAL_TABLET | ORAL | Status: AC
  Administered 2018-02-20: 600 mg via ORAL
  Filled 2018-02-20: qty 1

## 2018-02-20 MED ORDER — SODIUM CHLORIDE 0.9 % IV BOLUS
1000.0000 mL | Freq: Once | INTRAVENOUS | Status: AC
  Administered 2018-02-20: 1000 mL via INTRAVENOUS

## 2018-02-20 MED ORDER — SODIUM CHLORIDE 0.9 % IV BOLUS: 1000.0000 mL | Freq: Once | INTRAVENOUS | Status: DC

## 2018-02-20 MED ORDER — IBUPROFEN 600 MG PO TABS
600.0000 mg | ORAL_TABLET | Freq: Once | ORAL | Status: AC
  Administered 2018-02-20: 600 mg via ORAL

## 2018-02-20 MED ORDER — IPRATROPIUM-ALBUTEROL 0.5-2.5 (3) MG/3ML IN SOLN
9.0000 mL | Freq: Once | RESPIRATORY_TRACT | Status: AC
  Administered 2018-02-20: 9 mL via RESPIRATORY_TRACT
  Filled 2018-02-20: qty 9

## 2018-02-20 NOTE — ED Notes (Signed)
Multiple warm blankets placed on Pt, will relay recheck temp in 30 min for possible bear hugger.

## 2018-02-20 NOTE — ED Notes (Signed)
Pt increasingly alert, answers some questions appropriately.
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
## 2018-02-20 NOTE — ED Notes (Signed)
Patient changed to 4l [place] from non re breather per md order. Patient 95% on [place].
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
## 2018-02-21 ENCOUNTER — Inpatient Hospital Stay: Payer: Self-pay

## 2018-02-21 ENCOUNTER — Encounter: Payer: Self-pay | Admitting: Internal Medicine

## 2018-02-21 ENCOUNTER — Other Ambulatory Visit: Payer: Self-pay

## 2018-02-21 DIAGNOSIS — J9601 Acute respiratory failure with hypoxia: Secondary | ICD-10-CM | POA: Diagnosis present

## 2018-02-21 LAB — CREATININE, URINE, RANDOM: Creatinine, Urine: 38 mg/dL

## 2018-02-21 LAB — MAGNESIUM: MAGNESIUM: 2 mg/dL (ref 1.7–2.4)

## 2018-02-21 LAB — NA AND K (SODIUM & POTASSIUM), RAND UR
POTASSIUM UR: 15 mmol/L
SODIUM UR: 35 mmol/L

## 2018-02-21 LAB — PHOSPHORUS: Phosphorus: 1.4 mg/dL — ABNORMAL LOW (ref 2.5–4.6)

## 2018-02-21 LAB — PREALBUMIN: PREALBUMIN: 19.4 mg/dL (ref 18–38)

## 2018-02-21 MED ORDER — PNEUMOCOCCAL VAC POLYVALENT 25 MCG/0.5ML IJ INJ: 0.5000 mL | INJECTION | INTRAMUSCULAR | Status: DC

## 2018-02-21 MED ORDER — NALOXONE HCL 2 MG/2ML IJ SOSY
2.0000 mg | PREFILLED_SYRINGE | Freq: Once | INTRAMUSCULAR | Status: AC
  Administered 2018-02-21: 2 mg via INTRAVENOUS

## 2018-02-21 MED ORDER — ENOXAPARIN SODIUM 40 MG/0.4ML ~~LOC~~ SOLN
40.0000 mg | SUBCUTANEOUS | Status: DC
  Administered 2018-02-21 – 2018-02-22 (×2): 40 mg via SUBCUTANEOUS
  Filled 2018-02-21 (×2): qty 0.4

## 2018-02-21 MED ORDER — NALOXONE HCL 2 MG/2ML IJ SOSY
1.0000 mg | PREFILLED_SYRINGE | Freq: Once | INTRAMUSCULAR | Status: DC
  Filled 2018-02-21: qty 2

## 2018-02-21 MED ORDER — NALOXONE HCL 4 MG/10ML IJ SOLN
3.0000 mg/h | INTRAVENOUS | Status: DC
  Administered 2018-02-21 (×2): 3 mg/h via INTRAVENOUS
  Filled 2018-02-21 (×4): qty 10

## 2018-02-21 MED ORDER — SODIUM CHLORIDE 0.9 % IV SOLN
INTRAVENOUS | Status: DC
  Administered 2018-02-21 – 2018-02-22 (×2): via INTRAVENOUS

## 2018-02-21 MED ORDER — INFLUENZA VAC SPLIT QUAD 0.5 ML IM SUSY: 0.5000 mL | PREFILLED_SYRINGE | INTRAMUSCULAR | Status: DC

## 2018-02-21 MED ORDER — NALOXONE HCL 2 MG/2ML IJ SOSY
PREFILLED_SYRINGE | INTRAMUSCULAR | Status: AC
  Filled 2018-02-21: qty 2

## 2018-02-21 NOTE — ED Notes (Signed)
Patient's oxygen saturation decreased to 87% on RA. Patient placed on 2L [place]. MD informed. RN will continue to monitor

## 2018-02-21 NOTE — ED Notes (Signed)
This RN updated poison control on patient's current status and plan of care

## 2018-02-21 NOTE — ED Notes (Signed)
Report to Andrea, RN.

## 2018-02-21 NOTE — ED Notes (Signed)
Patient had episode of fecal incontinence. Sheets/pad changed, patient cleaned and incontinence brief placed on patient.

## 2018-02-21 NOTE — ED Notes (Signed)
Pt remains alert and oriented, appropriately conversing, clear speech. Sitter bedside.

## 2018-02-21 NOTE — ED Notes (Signed)
Ultrasound tech at bedside

## 2018-02-21 NOTE — ED Provider Notes (Addendum)
I reevaluated the patient and he was hypoxic to 90% on room air with a slow respiratory rate to about 10 or 12.  He was able to wake up and talk to me but he kept falling asleep mid conversation and persisted with opioid toxidrome.  He did confess to taking 90 mg of instant release oxycodone today and he said he takes that 3-4 times a week.  I then discussed the case with [facility] control who agreed with more naloxone and that the patient needs to be observed for 4 hours following last administration of IV naloxone.  We gave 2 mg now and his respiratory rate increased and he is now saturating 99% on room air.   Merrily Brittle, MD 02/21/18 0040  CRITICAL CARE Performed by: Merrily Brittle   Total critical care time: 30 minutes  Critical care time was exclusive of separately billable procedures and treating other patients.  Critical care was necessary to treat or prevent imminent or life-threatening deterioration.  Critical care was time spent personally by me on the following activities: development of treatment plan with patient and/or surrogate as well as nursing, discussions with consultants, evaluation of patient's response to treatment, examination of patient, obtaining history from patient or surrogate, ordering and performing treatments and interventions, ordering and review of laboratory studies, ordering and review of radiographic studies, pulse oximetry and re-evaluation of patient's condition.    Merrily Brittle, MD 02/21/18 0040  ----------------------------------------- 1:19 AM on 02/21/2018 -----------------------------------------  The patient is hypoxic once again to 86%.  We will give 2 more milligrams of naloxone however at this point the patient requires inpatient admission for naloxone infusion as it is been more than 7 hours of observation and now 5 mg of naloxone.   Merrily Brittle, MD 02/21/18 0120

## 2018-02-21 NOTE — ED Notes (Signed)
Patient incoherent. MD at bedside. MD ordered Narcan. Narcan administered. RN will continue to monitor.

## 2018-02-21 NOTE — ED Notes (Signed)
Patient is very restless.

## 2018-02-21 NOTE — ED Notes (Signed)
Dr. Renae Gloss states hold Narcan drip and watch patient for one hour. If pt LOC maintains pt can downgrade to medsurg.

## 2018-02-21 NOTE — Progress Notes (Signed)
Patient ID: Allen Sims, male   DOB: 03-Oct-1990, 27 y.o.   MRN: [id number]  [employer] PROGRESS NOTE  ASAEL PANN WUJ:[id number] DOB: 07-Jul-1990 DOA: 02/20/2018 PCP: Patient, No Pcp Per  HPI/Subjective: Patient states he thinks he used to much drugs.  He is more than usual.  Feels okay now.  Had some diarrhea last night.  Objective: Vitals:   02/21/18 1415 02/21/18 1519  BP: 106/68 132/77  Pulse: (!) 114 (!) 119  Resp:    Temp:  98.1 F (36.7 C)  SpO2: 100% 97%    Filed Weights   02/20/18 1845  Weight: 88.5 kg    ROS: Review of Systems  Constitutional: Negative for chills and fever.  Eyes: Negative for blurred vision.  Respiratory: Negative for cough and shortness of breath.   Cardiovascular: Negative for chest pain.  Gastrointestinal: Positive for abdominal pain and diarrhea. Negative for constipation, nausea and vomiting.  Genitourinary: Negative for dysuria.  Musculoskeletal: Negative for joint pain.  Neurological: Negative for dizziness and headaches.   Exam: Physical Exam  HENT:  Nose: No mucosal edema.  Mouth/Throat: No oropharyngeal exudate or posterior oropharyngeal edema.  Eyes: Pupils are equal, round, and reactive to light. Conjunctivae, EOM and lids are normal.  Neck: No JVD present. Carotid bruit is not present. No edema present. No thyroid mass and no thyromegaly present.  Cardiovascular: S1 normal and S2 normal. Exam reveals no gallop.  No murmur heard. Pulses:      Dorsalis pedis pulses are 2+ on the right side, and 2+ on the left side.  Respiratory: No respiratory distress. He has no wheezes. He has no rhonchi. He has no rales.  GI: Soft. Bowel sounds are normal. There is no tenderness.  Musculoskeletal:       Right ankle: He exhibits no swelling.       Left ankle: He exhibits no swelling.  Lymphadenopathy:    He has no cervical adenopathy.  Neurological: He is alert. No cranial nerve deficit.  Skin: Skin is warm. No rash noted.  Nails show no clubbing.  Psychiatric: He has a normal mood and affect.      Data Reviewed: Basic Metabolic Panel: Recent Labs  Lab 02/20/18 1824 02/20/18 2138 02/21/18 0523  NA 141 138  --   K 5.4* 4.8  --   CL 96* 102  --   CO2 29 25  --   GLUCOSE 150* 122*  --   BUN 15 16  --   CREATININE 1.58* 1.31*  --   CALCIUM 9.1 8.4*  --   MG  --   --  2.0  PHOS  --   --  1.4*   Liver Function Tests: Recent Labs  Lab 02/20/18 1824 02/20/18 2138  AST 127* 113*  ALT 264* 242*  ALKPHOS 135* 114  BILITOT 0.7 1.1  PROT 8.4* 7.6  ALBUMIN 4.4 4.4   CBC: Recent Labs  Lab 02/20/18 1824  WBC 21.0*  NEUTROABS 17.7*  HGB 16.8  HCT 50.4  MCV 92.3  PLT 400    CBG: Recent Labs  Lab 02/20/18 1832  GLUCAP 121*      Studies: Dg Chest 1 View  Result Date: 02/20/2018 CLINICAL DATA:  Shortness of breath EXAM: CHEST  1 VIEW COMPARISON:  None. FINDINGS: The heart size and mediastinal contours are within normal limits. Both lungs are clear. The visualized skeletal structures are unremarkable. IMPRESSION: No active disease. Electronically Signed   By: Adrian Prows.D.  On: 02/20/2018 18:50   US Renal  Result Date: 02/21/2018 CLINICAL DATA:  Acute kidney injury. EXAM: RENAL / URINARY TRACT ULTRASOUND COMPLETE COMPARISON:  Right upper quadrant abdominal ultrasound obtained today. Abdomen and pelvis CT dated 08/06/2015. FINDINGS: Right Kidney: Length: 11.1 cm. Echogenicity within normal limits. No mass or hydronephrosis visualized. Left Kidney: Length: 11.2 cm. Echogenicity within normal limits. No mass or hydronephrosis visualized. Bladder: Appears normal for degree of bladder distention. IMPRESSION: Normal examination. Electronically Signed   By: Beckie Salts M.D.   On: 02/21/2018 10:42   US Abdomen Limited Ruq  Result Date: 02/21/2018 CLINICAL DATA:  27 year old male with elevated liver enzymes. EXAM: ULTRASOUND ABDOMEN LIMITED RIGHT UPPER QUADRANT COMPARISON:  CT of the  abdomen pelvis dated 08/06/2015 FINDINGS: Gallbladder: A 1.3 x 1.4 x 1.6 cm rounded cystic structure in the region of the gallbladder may represent portion of the gallbladder in this patient with provided history of prior cholecystectomy. No stone or fluid. Common bile duct: Diameter: 4 mm Liver: There is diffuse increased liver echogenicity most consistent with fatty infiltration. Portal vein is patent on color Doppler imaging with normal direction of blood flow towards the liver. IMPRESSION: 1. Cholecystectomy with probable remnant portion of the gallbladder. 2. Fatty liver. Electronically Signed   By: Elgie Collard M.D.   On: 02/21/2018 04:35    Scheduled Meds: . enoxaparin (LOVENOX) injection  40 mg Subcutaneous Q24H  . [START ON 02/22/2018] Influenza vac split quadrivalent PF  0.5 mL Intramuscular Tomorrow-1000  . [START ON 02/22/2018] pneumococcal 23 valent vaccine  0.5 mL Intramuscular Tomorrow-1000   Continuous Infusions: . naLOXone (NARCAN) adult infusion for OVERDOSE Stopped (02/21/18 0826)    Assessment/Plan:  1. Acute hypoxic respiratory failure likely secondary to drugs.  Patient breathing comfortably on room air.  Narcan drip stopped.  Observe overnight. 2. Drug use.  Patient has no thoughts of hurting himself or other people. 3. Acute kidney injury.  Gentle IV fluid hydration 4. Elevated liver function test secondary to polysubstance abuse. 5. Polysubstance abuse.  Advised to stop illicit drugs.  Code Status:     Code Status Orders  (From admission, onward)         Start     Ordered   02/21/18 0828  Full code  Continuous     02/21/18 0827        Code Status History    Date Active Date Inactive Code Status Order ID Comments User Context   10/24/2015 1925 10/28/2015 2010 Full Code [id number]  Montez Morita, PA-C Inpatient      Disposition Plan: To be determined  Time spent: 31 minutes  Dyron Kawano [employer]
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
## 2018-02-22 ENCOUNTER — Other Ambulatory Visit: Payer: Self-pay

## 2018-02-22 DIAGNOSIS — F1410 Cocaine abuse, uncomplicated: Secondary | ICD-10-CM

## 2018-02-22 DIAGNOSIS — F4325 Adjustment disorder with mixed disturbance of emotions and conduct: Secondary | ICD-10-CM

## 2018-02-22 DIAGNOSIS — F1310 Sedative, hypnotic or anxiolytic abuse, uncomplicated: Secondary | ICD-10-CM

## 2018-02-22 DIAGNOSIS — F1110 Opioid abuse, uncomplicated: Secondary | ICD-10-CM

## 2018-02-22 LAB — BASIC METABOLIC PANEL
Anion gap: 9 (ref 5–15)
BUN: 20 mg/dL (ref 6–20)
CHLORIDE: 104 mmol/L (ref 98–111)
CO2: 26 mmol/L (ref 22–32)
CREATININE: 0.98 mg/dL (ref 0.61–1.24)
Calcium: 9.4 mg/dL (ref 8.9–10.3)
GFR calc Af Amer: >60 mL/min (ref >60)
GFR calc non Af Amer: >60 mL/min (ref >60)
Glucose, Bld: 103 mg/dL — ABNORMAL HIGH (ref 70–99)
POTASSIUM: 3.8 mmol/L (ref 3.5–5.1)
Sodium: 139 mmol/L (ref 135–145)

## 2018-02-22 LAB — GASTROINTESTINAL PANEL BY PCR, STOOL (REPLACES STOOL CULTURE)
ADENOVIRUS F40/41: NOT DETECTED
Astrovirus: NOT DETECTED
CRYPTOSPORIDIUM: NOT DETECTED
CYCLOSPORA CAYETANENSIS: NOT DETECTED
Campylobacter species: NOT DETECTED
ENTAMOEBA HISTOLYTICA: NOT DETECTED
ENTEROAGGREGATIVE E COLI (EAEC): DETECTED — AB
ENTEROPATHOGENIC E COLI (EPEC): NOT DETECTED
ENTEROTOXIGENIC E COLI (ETEC): NOT DETECTED
Giardia lamblia: NOT DETECTED
Norovirus GI/GII: NOT DETECTED
PLESIMONAS SHIGELLOIDES: NOT DETECTED
Rotavirus A: NOT DETECTED
SAPOVIRUS (I, II, IV, AND V): NOT DETECTED
SHIGA LIKE TOXIN PRODUCING E COLI (STEC): NOT DETECTED
Salmonella species: NOT DETECTED
Shigella/Enteroinvasive E coli (EIEC): NOT DETECTED
VIBRIO CHOLERAE: NOT DETECTED
VIBRIO SPECIES: NOT DETECTED
Yersinia enterocolitica: NOT DETECTED

## 2018-02-22 LAB — C DIFFICILE QUICK SCREEN W PCR REFLEX
C Diff antigen: NEGATIVE
C Diff interpretation: NOT DETECTED
C Diff toxin: NEGATIVE

## 2018-02-22 MED ORDER — LEVOFLOXACIN 500 MG PO TABS: 500.0000 mg | ORAL_TABLET | Freq: Every day | ORAL | 0 refills | Status: AC

## 2018-02-22 MED ORDER — LEVOFLOXACIN 500 MG PO TABS
500.0000 mg | ORAL_TABLET | Freq: Every day | ORAL | Status: DC
  Administered 2018-02-22: 500 mg via ORAL
  Filled 2018-02-22: qty 1

## 2018-02-22 MED ORDER — LOPERAMIDE HCL 2 MG PO CAPS: 2.0000 mg | ORAL_CAPSULE | Freq: Three times a day (TID) | ORAL | Status: DC | PRN
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
## 2018-02-22 NOTE — Progress Notes (Signed)
Per Dr. Drue Second, isolation is not required for Enteroaggregative E. Coli.

## 2018-02-22 NOTE — Progress Notes (Signed)
Dr. Renae Gloss was notified that patients GI panel pcr is positive for Enteroaggregative E. Coli.

## 2018-02-22 NOTE — Progress Notes (Signed)
Patient's mother is present for discharge and is very supportive. Her telephone number was updated in the computer. Discharge instructions, prescription and instructions for follow up was reviewed with the patient and his mother. She will be transporting him home.

## 2018-02-24 LAB — CALCIUM, IONIZED: Calcium, Ionized, Serum: 5 mg/dL (ref 4.5–5.6)

## 2018-02-24 LAB — HIV ANTIBODY (ROUTINE TESTING W REFLEX): HIV Screen 4th Generation wRfx: NONREACTIVE

## 2018-02-24 LAB — UREA NITROGEN, URINE: Urea Nitrogen, Ur: 290 mg/dL

## 2018-04-29 DEATH — deceased

## 2020-06-14 IMAGING — DX DG CHEST 1V
1 series · 1 of 1 positions shown · non-contrast
Comparison: None.

CLINICAL DATA: Shortness of breath

EXAM:
CHEST  1 VIEW

[chest ap]
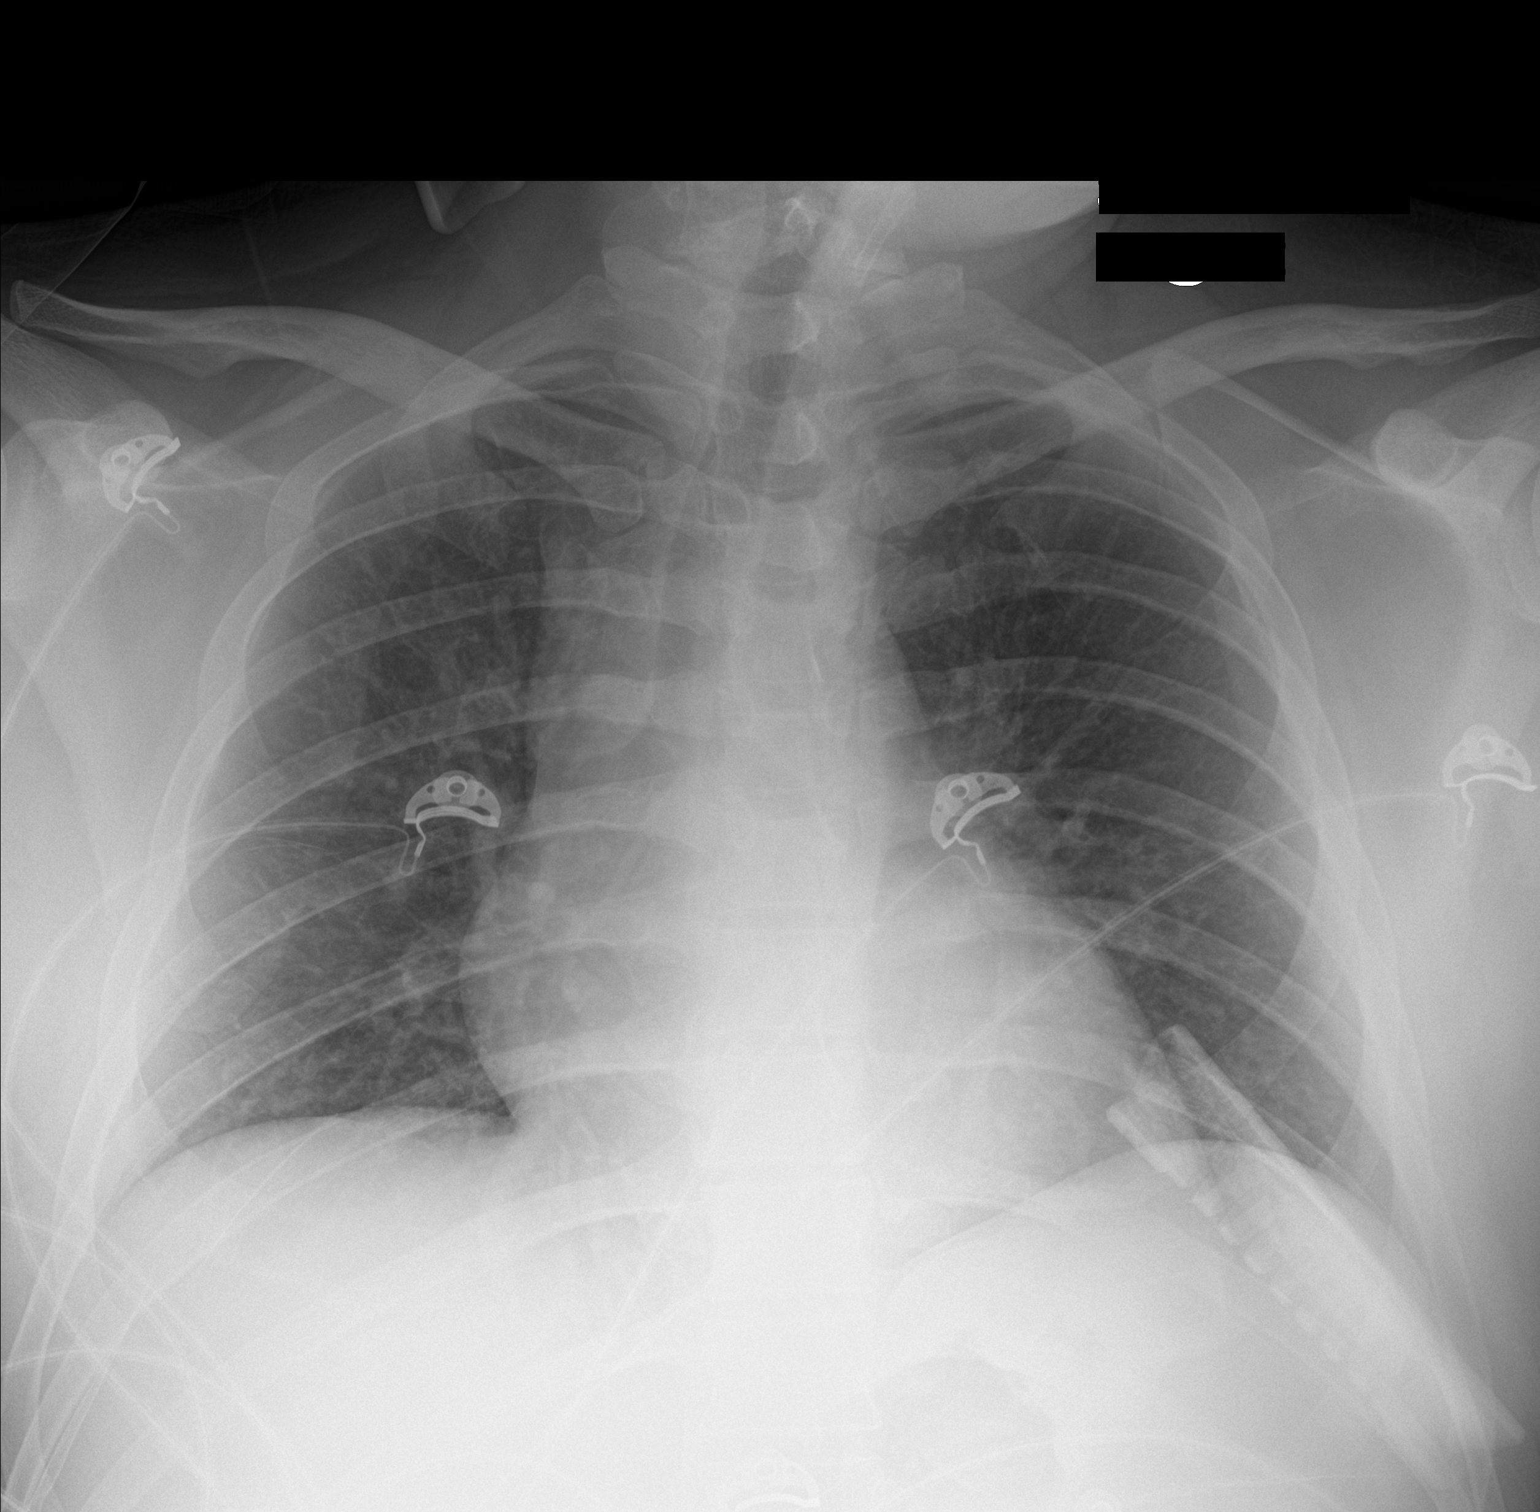

[1 of 1 positions shown; findings below may reference images not displayed]

FINDINGS: The heart size and mediastinal contours are within normal limits.
Both lungs are clear. The visualized skeletal structures are
unremarkable.
IMPRESSION: No active disease.

## 2020-06-15 IMAGING — US US ABDOMEN LIMITED
1 series · 14 of 25 positions shown · non-contrast
Comparison: CT of the abdomen pelvis dated 08/06/2015

CLINICAL DATA: 26-year-old male with elevated liver enzymes.

EXAM:
ULTRASOUND ABDOMEN LIMITED RIGHT UPPER QUADRANT

[Series 1: us abdomen limited · 14 of 30 slices shown]
[im 1/30]
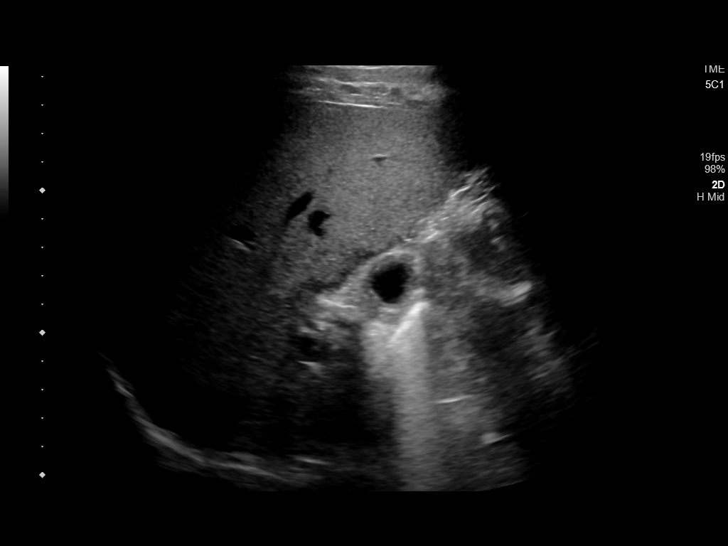
[im 3/30]
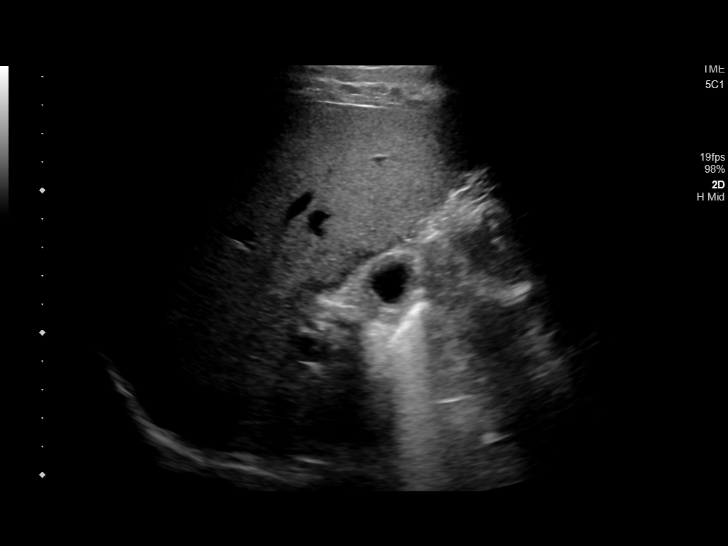
[im 5/30]
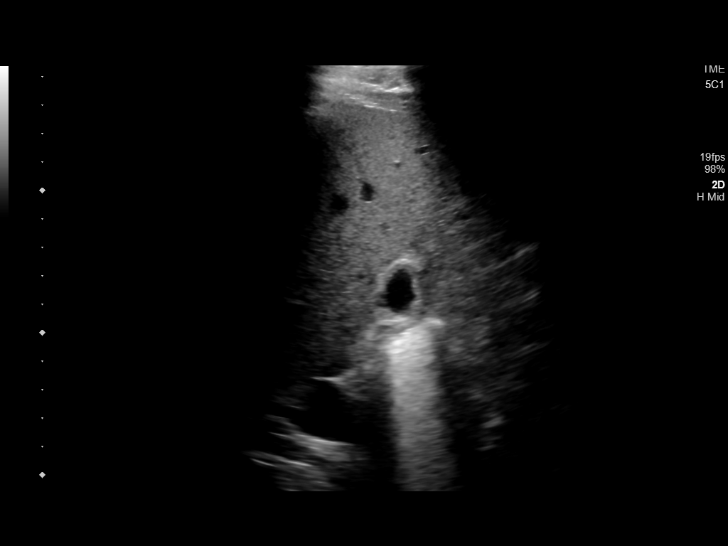
[im 8/30]
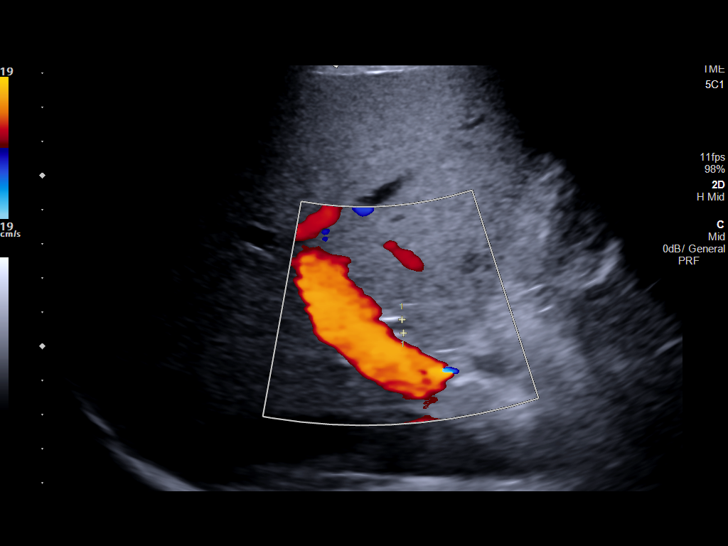
[im 10/30]
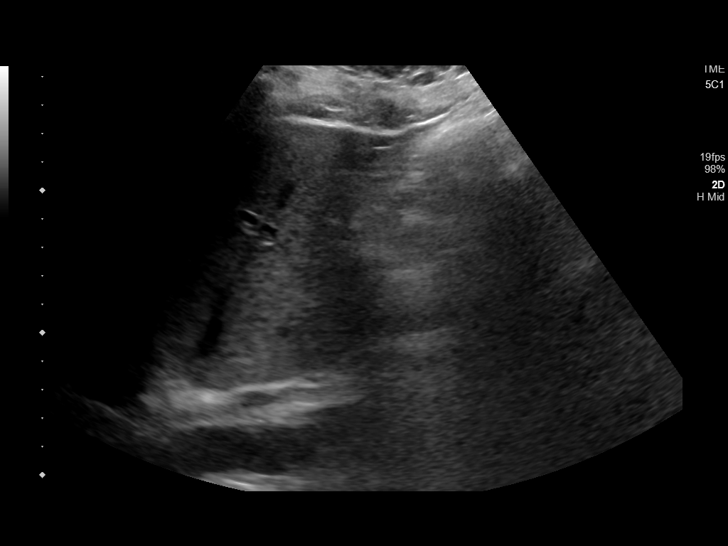
[im 11/30]
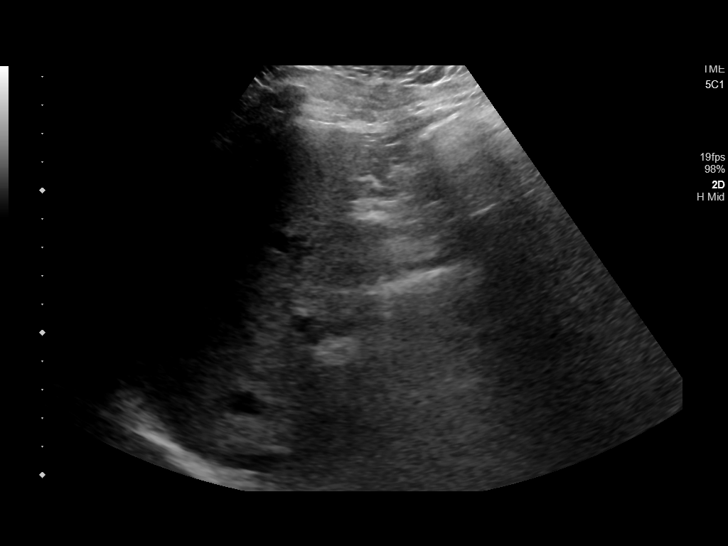
[im 14/30]
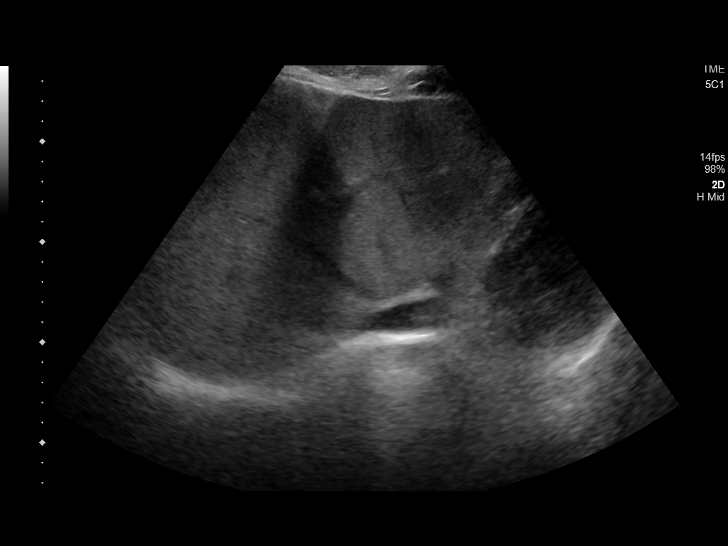
[im 16/30]
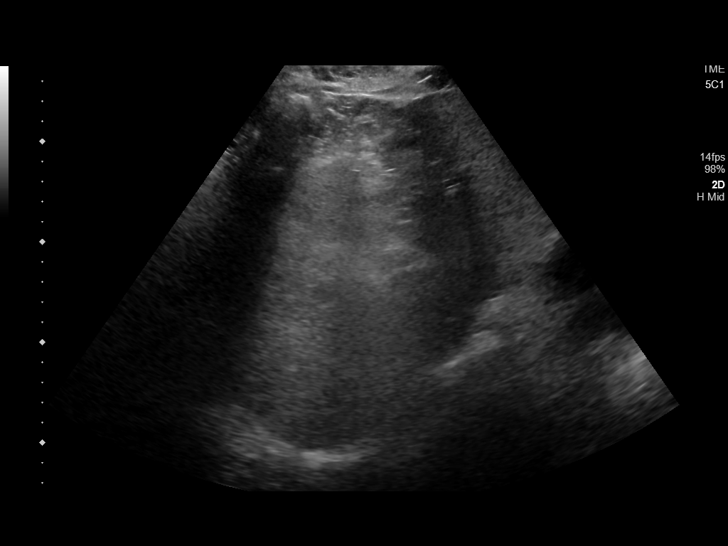
[im 19/30]
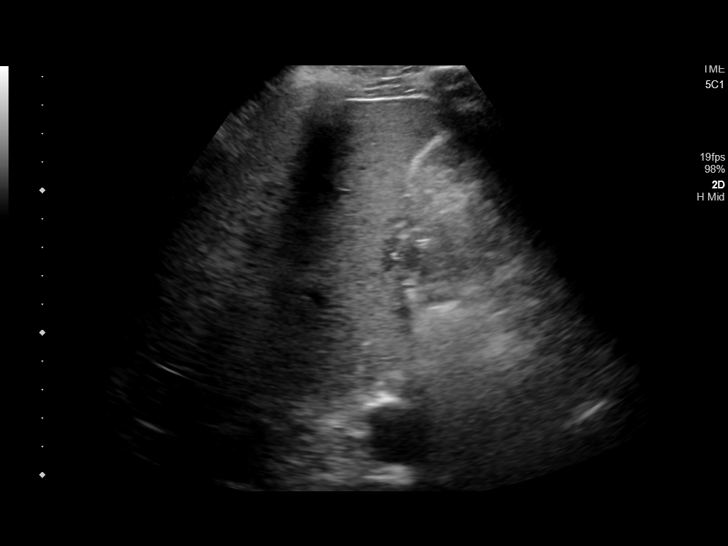
[im 20/30]
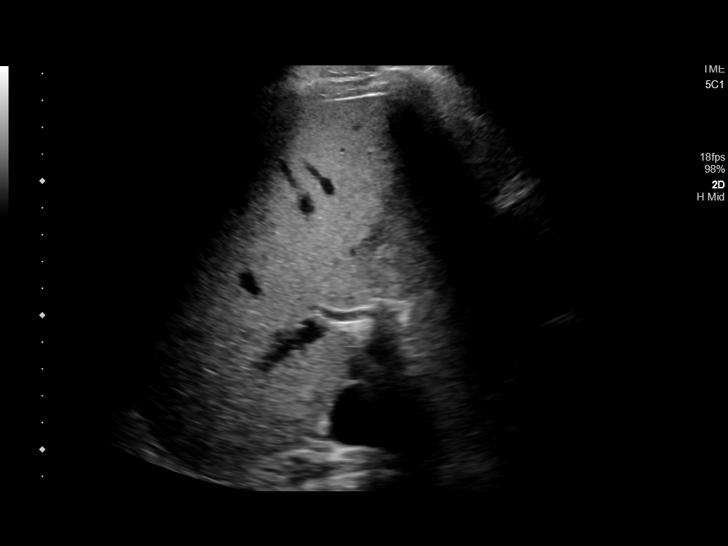
[im 22/30]
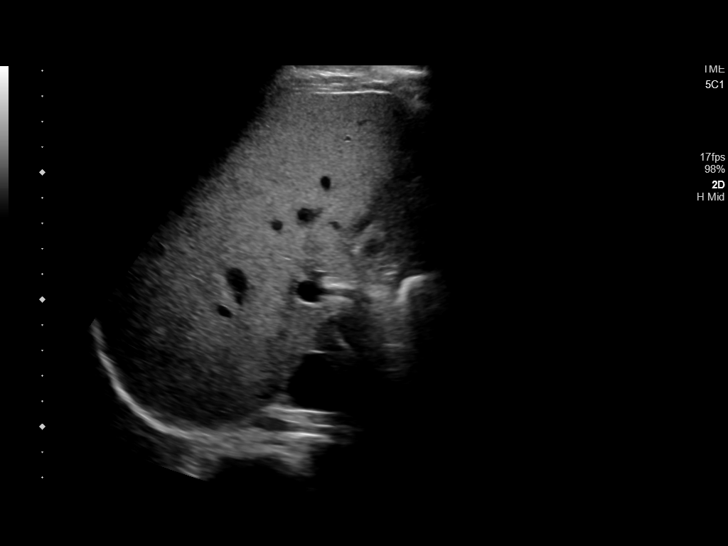
[im 25/30]
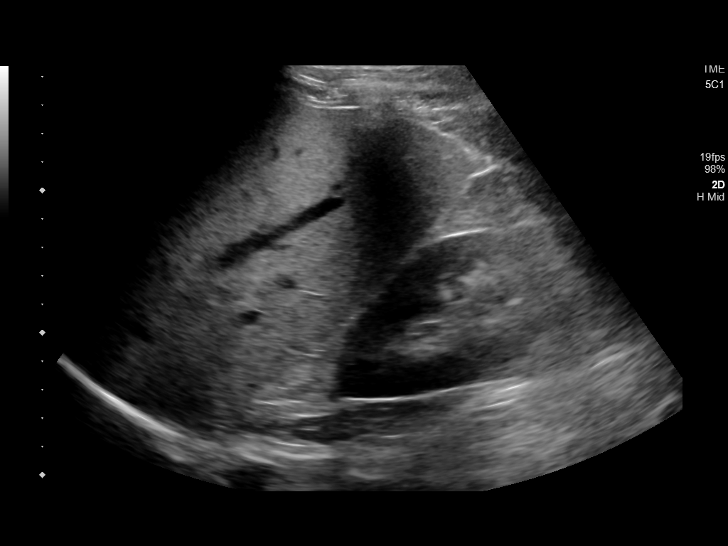
[im 27/30]
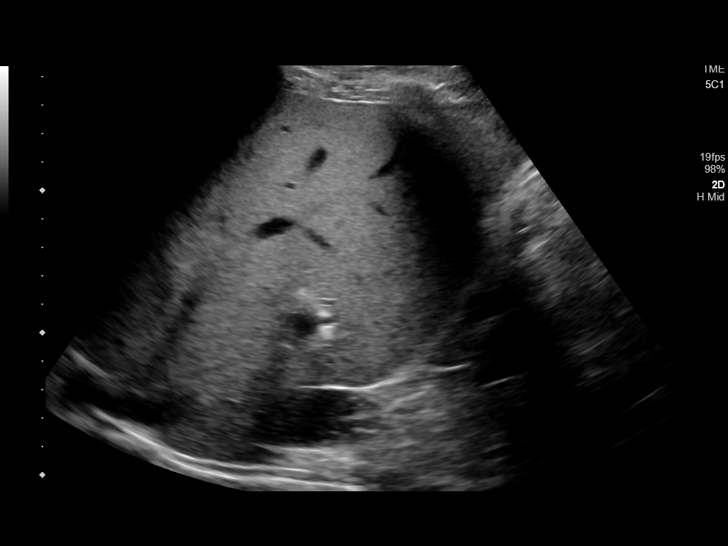
[im 30/30]
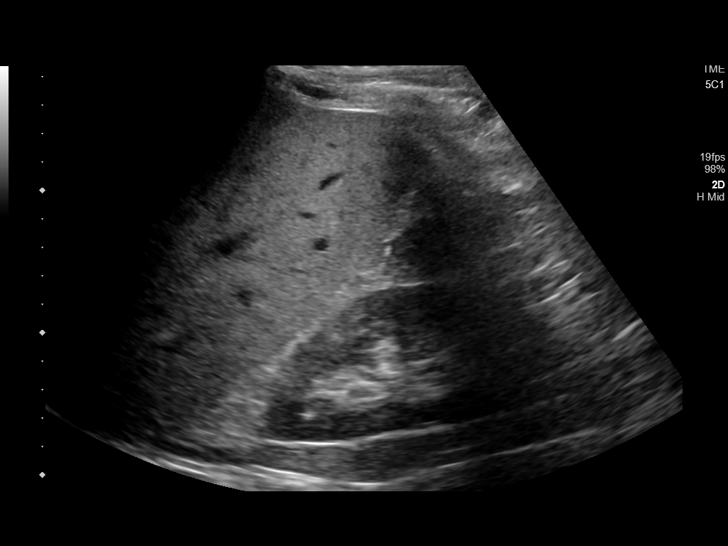

[14 of 25 positions shown; findings below may reference images not displayed]

FINDINGS: Gallbladder:

A 1.3 x 1.4 x 1.6 cm rounded cystic structure in the region of the
gallbladder may represent portion of the gallbladder in this patient
with provided history of prior cholecystectomy. No stone or fluid.

Common bile duct:

Diameter: 4 mm

Liver:

There is diffuse increased liver echogenicity most consistent with
fatty infiltration. Portal vein is patent on color Doppler imaging
with normal direction of blood flow towards the liver.
IMPRESSION: 1. Cholecystectomy with probable remnant portion of the gallbladder.
2. Fatty liver.
